# Patient Record
Sex: Female | Born: 1960 | Race: White | Hispanic: No | Marital: Married | Smoking: Former smoker
Health system: Southern US, Community
[De-identification: ages and names within clinical notes are randomized; demographics above are authoritative.]

## PROBLEM LIST (undated history)

## (undated) DIAGNOSIS — I2510 Atherosclerotic heart disease of native coronary artery without angina pectoris: Secondary | ICD-10-CM

## (undated) DIAGNOSIS — I219 Acute myocardial infarction, unspecified: Secondary | ICD-10-CM

## (undated) DIAGNOSIS — E669 Obesity, unspecified: Secondary | ICD-10-CM

## (undated) DIAGNOSIS — I499 Cardiac arrhythmia, unspecified: Secondary | ICD-10-CM

## (undated) DIAGNOSIS — K219 Gastro-esophageal reflux disease without esophagitis: Secondary | ICD-10-CM

## (undated) DIAGNOSIS — I10 Essential (primary) hypertension: Secondary | ICD-10-CM

## (undated) DIAGNOSIS — J449 Chronic obstructive pulmonary disease, unspecified: Secondary | ICD-10-CM

## (undated) DIAGNOSIS — E785 Hyperlipidemia, unspecified: Secondary | ICD-10-CM

## (undated) DIAGNOSIS — K859 Acute pancreatitis without necrosis or infection, unspecified: Secondary | ICD-10-CM

## (undated) DIAGNOSIS — M79602 Pain in left arm: Secondary | ICD-10-CM

## (undated) DIAGNOSIS — K760 Fatty (change of) liver, not elsewhere classified: Secondary | ICD-10-CM

## (undated) DIAGNOSIS — Z720 Tobacco use: Secondary | ICD-10-CM

## (undated) HISTORY — DX: Cardiac arrhythmia, unspecified: I49.9

## (undated) HISTORY — DX: Chronic obstructive pulmonary disease, unspecified: J44.9

## (undated) HISTORY — DX: Hyperlipidemia, unspecified: E78.5

## (undated) HISTORY — DX: Acute myocardial infarction, unspecified: I21.9

## (undated) HISTORY — DX: Obesity, unspecified: E66.9

## (undated) HISTORY — DX: Fatty (change of) liver, not elsewhere classified: K76.0

## (undated) HISTORY — DX: Pain in left arm: M79.602

## (undated) HISTORY — DX: Essential (primary) hypertension: I10

## (undated) HISTORY — DX: Gastro-esophageal reflux disease without esophagitis: K21.9

## (undated) HISTORY — DX: Atherosclerotic heart disease of native coronary artery without angina pectoris: I25.10

## (undated) HISTORY — DX: Tobacco use: Z72.0

---

## 2007-03-18 DIAGNOSIS — I219 Acute myocardial infarction, unspecified: Secondary | ICD-10-CM

## 2007-03-18 HISTORY — DX: Acute myocardial infarction, unspecified: I21.9

## 2007-05-02 ENCOUNTER — Emergency Department (HOSPITAL_COMMUNITY): Admission: EM | Admit: 2007-05-02 | Discharge: 2007-05-02 | Payer: Self-pay | Admitting: Emergency Medicine

## 2007-05-18 ENCOUNTER — Inpatient Hospital Stay (HOSPITAL_COMMUNITY): Admission: EM | Admit: 2007-05-18 | Discharge: 2007-05-26 | Payer: Self-pay | Admitting: Emergency Medicine

## 2007-05-18 ENCOUNTER — Ambulatory Visit: Payer: Self-pay | Admitting: Cardiology

## 2007-05-19 ENCOUNTER — Encounter (INDEPENDENT_AMBULATORY_CARE_PROVIDER_SITE_OTHER): Payer: Self-pay | Admitting: *Deleted

## 2007-05-20 ENCOUNTER — Ambulatory Visit: Payer: Self-pay | Admitting: Thoracic Surgery (Cardiothoracic Vascular Surgery)

## 2007-05-20 ENCOUNTER — Encounter (INDEPENDENT_AMBULATORY_CARE_PROVIDER_SITE_OTHER): Payer: Self-pay | Admitting: *Deleted

## 2007-05-21 ENCOUNTER — Encounter: Payer: Self-pay | Admitting: Thoracic Surgery (Cardiothoracic Vascular Surgery)

## 2007-05-21 HISTORY — PX: CORONARY ARTERY BYPASS GRAFT: SHX141

## 2007-05-28 ENCOUNTER — Emergency Department (HOSPITAL_COMMUNITY): Admission: EM | Admit: 2007-05-28 | Discharge: 2007-05-28 | Payer: Self-pay | Admitting: Emergency Medicine

## 2007-06-17 ENCOUNTER — Ambulatory Visit: Payer: Self-pay | Admitting: Cardiothoracic Surgery

## 2007-06-17 ENCOUNTER — Encounter: Admission: RE | Admit: 2007-06-17 | Discharge: 2007-06-17 | Payer: Self-pay | Admitting: Cardiothoracic Surgery

## 2007-07-01 ENCOUNTER — Encounter (HOSPITAL_COMMUNITY): Admission: RE | Admit: 2007-07-01 | Discharge: 2007-09-29 | Payer: Self-pay | Admitting: *Deleted

## 2007-08-04 ENCOUNTER — Ambulatory Visit: Payer: Self-pay | Admitting: Cardiology

## 2007-08-05 ENCOUNTER — Observation Stay (HOSPITAL_COMMUNITY): Admission: EM | Admit: 2007-08-05 | Discharge: 2007-08-05 | Payer: Self-pay | Admitting: Emergency Medicine

## 2008-06-15 ENCOUNTER — Emergency Department (HOSPITAL_COMMUNITY): Admission: EM | Admit: 2008-06-15 | Discharge: 2008-06-15 | Payer: Self-pay | Admitting: Emergency Medicine

## 2008-08-12 ENCOUNTER — Emergency Department (HOSPITAL_COMMUNITY): Admission: EM | Admit: 2008-08-12 | Discharge: 2008-08-12 | Payer: Self-pay | Admitting: Emergency Medicine

## 2008-10-17 ENCOUNTER — Inpatient Hospital Stay (HOSPITAL_COMMUNITY): Admission: EM | Admit: 2008-10-17 | Discharge: 2008-10-21 | Payer: Self-pay | Admitting: Emergency Medicine

## 2008-10-19 ENCOUNTER — Ambulatory Visit: Payer: Self-pay | Admitting: Internal Medicine

## 2008-10-20 ENCOUNTER — Encounter: Payer: Self-pay | Admitting: Internal Medicine

## 2008-10-23 ENCOUNTER — Encounter: Payer: Self-pay | Admitting: Internal Medicine

## 2010-02-19 ENCOUNTER — Emergency Department (HOSPITAL_COMMUNITY)
Admission: EM | Admit: 2010-02-19 | Discharge: 2010-02-19 | Payer: Self-pay | Source: Home / Self Care | Admitting: Emergency Medicine

## 2010-03-01 ENCOUNTER — Ambulatory Visit: Payer: Self-pay | Admitting: Cardiology

## 2010-03-07 ENCOUNTER — Telehealth (INDEPENDENT_AMBULATORY_CARE_PROVIDER_SITE_OTHER): Payer: Self-pay | Admitting: Radiology

## 2010-03-12 ENCOUNTER — Encounter: Payer: Self-pay | Admitting: Cardiology

## 2010-03-12 ENCOUNTER — Encounter (HOSPITAL_COMMUNITY)
Admission: RE | Admit: 2010-03-12 | Discharge: 2010-04-16 | Payer: Self-pay | Source: Home / Self Care | Attending: Cardiology | Admitting: Cardiology

## 2010-03-12 ENCOUNTER — Ambulatory Visit: Payer: Self-pay

## 2010-03-13 ENCOUNTER — Ambulatory Visit: Admission: RE | Admit: 2010-03-13 | Discharge: 2010-03-13 | Payer: Self-pay | Source: Home / Self Care

## 2010-04-18 NOTE — Progress Notes (Signed)
Summary: nuc pre-procedure  Phone Note Outgoing Call   Call placed by: Domenic Polite, CNMT,  March 07, 2010 11:23 AM Call placed to: Patient Reason for Call: Confirm/change Appt Summary of Call: Reviewed information on [employer] Information Sheet (see scanned document for further details).  Spoke with patient's husband.      Nuclear Med Background Indications for Stress Test: Evaluation for Ischemia, Post Hospital  Indications Comments: Post Hosp. 02/22/10  CP- Lt arm pain R/O MI  History: CABG, Echo, Heart Catheterization, Myocardial Infarction  History Comments: '09 MI-ant&lat stemi / cath / Cabg x 5 / Echo-nml EF= 55%  Symptoms: Chest Pain, SOB  Symptoms Comments: CP> Lt Arm   Nuclear Pre-Procedure Cardiac Risk Factors: Hypertension, Lipids, Smoker

## 2010-04-18 NOTE — Assessment & Plan Note (Signed)
Summary: Cardiology Nuclear Testing  Nuclear Med Background Indications for Stress Test: Evaluation for Ischemia, Post Hospital  Indications Comments: Post Hosp. 02/22/10  CP- Lt arm pain R/O MI  History: CABG, Echo, Heart Catheterization, Myocardial Infarction  History Comments: '09 MI-ant&lat stemi / cath / Cabg x 5 / Echo-nml EF= 55%  Symptoms: Chest Pain, DOE, Fatigue, Rapid HR, SOB  Symptoms Comments: CP> Lt Arm   Nuclear Pre-Procedure Cardiac Risk Factors: Family History - CAD, Hypertension, Lipids, Smoker Caffeine/Decaff Intake: none NPO After: 6:30 PM IV 0.9% NS with Angio Cath: 22g     IV Site: L Hand IV Started by: Cathlyn Parsons, RN Chest Size (in) 42     Cup Size D     Height (in): 59 Weight (lb): 208 BMI: 42.16 Tech Comments: Toprol held x 24hrs.  Nuclear Med Study 1 or 2 day study:  2 day     Stress Test Type:  Stress Reading MD:  Olga Millers, MD     Referring MD:  Dr. Peter Swaziland Resting Radionuclide:  Technetium 72m Tetrofosmin     Resting Radionuclide Dose:  33 mCi  Stress Radionuclide:  Technetium 42m Tetrofosmin     Stress Radionuclide Dose:  33 mCi   Stress Protocol Exercise Time (min):  6:00 min     Max HR:  150 bpm     Predicted Max HR:  169 bpm  Max Systolic BP: 183 mm Hg     Percent Max HR:  88.76 %     METS: 7.0 Rate Pressure Product:  62952    Stress Test Technologist:  Stanton Kidney, EMT-P     Nuclear Technologist:  Doyne Keel, CNMT  Rest Procedure  Myocardial perfusion imaging was performed at rest 45 minutes following the intravenous administration of Technetium 70m Tetrofosmin.  Stress Procedure  The patient exercised for 6 minutes.  The patient stopped due to fatigue and dyspnea, and Pt. denied any chest pain.  There were no significant ST-T wave changes.  Technetium 70m Tetrofosmin was injected at peak exercise and myocardial perfusion imaging was performed after a brief delay.  QPS Raw Data Images:  Acquisition technically good;  normal left ventricular size. Stress Images:  There is decreased uptake in the anterior wall and apex. Rest Images:  There is decreased uptake in the anterior wall and apex. Subtraction (SDS):  No evidence of ischemia. Transient Ischemic Dilatation:  .84  (Normal <1.22)  Lung/Heart Ratio:  .26  (Normal <0.45)  Quantitative Gated Spect Images QGS EDV:  79 ml QGS ESV:  33 ml QGS EF:  58 % QGS cine images:  Anterior and apical akinesis.   Overall Impression  Exercise Capacity: Fair exercise capacity. BP Response: Normal blood pressure response. Clinical Symptoms: No chest pain ECG Impression: No significant ST segment change suggestive of ischemia. Overall Impression: Abnormal stress nuclear study with prior anterior and apical infarct; no ischemia.  Appended Document: Cardiology Nuclear Testing COPY SENT TO DR. Sw[place]iland

## 2010-05-28 LAB — TROPONIN I: Troponin I: 0.01 ng/mL (ref 0.00–0.06)

## 2010-05-28 LAB — BASIC METABOLIC PANEL
CO2: 20 mEq/L (ref 19–32)
Calcium: 8.4 mg/dL (ref 8.4–10.5)
GFR calc Af Amer: >60 mL/min (ref >60)
Potassium: 3.1 mEq/L — ABNORMAL LOW (ref 3.5–5.1)
Sodium: 141 mEq/L (ref 135–145)

## 2010-05-28 LAB — DIFFERENTIAL
Basophils Absolute: 0 10*3/uL (ref 0.0–0.1)
Basophils Relative: 0 % (ref 0–1)
Eosinophils Relative: 1 % (ref 0–5)
Monocytes Absolute: 0.4 10*3/uL (ref 0.1–1.0)
Monocytes Relative: 6 % (ref 3–12)

## 2010-05-28 LAB — CK TOTAL AND CKMB (NOT AT ARMC)
Relative Index: INVALID (ref 0.0–2.5)
Total CK: 66 U/L (ref 7–177)

## 2010-05-28 LAB — POCT CARDIAC MARKERS
CKMB, poc: <1 ng/mL — ABNORMAL LOW (ref 1.0–8.0)
CKMB, poc: <1 ng/mL — ABNORMAL LOW (ref 1.0–8.0)
Myoglobin, poc: 33.2 ng/mL (ref 12–200)
Myoglobin, poc: 38 ng/mL (ref 12–200)
Troponin i, poc: <0.05 ng/mL (ref 0.00–0.09)

## 2010-05-28 LAB — CBC
HCT: 39.1 % (ref 36.0–46.0)
Hemoglobin: 12.6 g/dL (ref 12.0–15.0)
MCH: 28 pg (ref 26.0–34.0)
RBC: 4.5 MIL/uL (ref 3.87–5.11)

## 2010-06-22 LAB — COMPREHENSIVE METABOLIC PANEL
ALT: 9 U/L (ref 0–35)
ALT: 9 U/L (ref 0–35)
AST: 15 U/L (ref 0–37)
AST: 23 U/L (ref 0–37)
Albumin: 3.3 g/dL — ABNORMAL LOW (ref 3.5–5.2)
Alkaline Phosphatase: 106 U/L (ref 39–117)
BUN: 10 mg/dL (ref 6–23)
CO2: 26 mEq/L (ref 19–32)
Calcium: 8.1 mg/dL — ABNORMAL LOW (ref 8.4–10.5)
Chloride: 110 mEq/L (ref 96–112)
Creatinine, Ser: 0.58 mg/dL (ref 0.4–1.2)
GFR calc Af Amer: >60 mL/min (ref >60)
GFR calc Af Amer: >60 mL/min (ref >60)
GFR calc non Af Amer: >60 mL/min (ref >60)
Glucose, Bld: 91 mg/dL (ref 70–99)
Potassium: 3.6 mEq/L (ref 3.5–5.1)
Sodium: 139 mEq/L (ref 135–145)
Sodium: 139 mEq/L (ref 135–145)
Total Bilirubin: 0.8 mg/dL (ref 0.3–1.2)
Total Protein: 6.1 g/dL (ref 6.0–8.3)
Total Protein: 7 g/dL (ref 6.0–8.3)

## 2010-06-22 LAB — AMYLASE: Amylase: 55 U/L (ref 27–131)

## 2010-06-22 LAB — URINALYSIS, ROUTINE W REFLEX MICROSCOPIC
Glucose, UA: NEGATIVE mg/dL
Hgb urine dipstick: NEGATIVE
Specific Gravity, Urine: 1.038 — ABNORMAL HIGH (ref 1.005–1.030)
Urobilinogen, UA: 1 mg/dL (ref 0.0–1.0)

## 2010-06-22 LAB — CBC
HCT: 40.6 % (ref 36.0–46.0)
MCHC: 34.4 g/dL (ref 30.0–36.0)
MCV: 87.2 fL (ref 78.0–100.0)
Platelets: 231 10*3/uL (ref 150–400)
RBC: 4.24 MIL/uL (ref 3.87–5.11)
RDW: 14.8 % (ref 11.5–15.5)
RDW: 15.1 % (ref 11.5–15.5)
WBC: 9.8 10*3/uL (ref 4.0–10.5)

## 2010-06-22 LAB — LIPID PANEL
Total CHOL/HDL Ratio: 6.6 RATIO
VLDL: 25 mg/dL (ref 0–40)

## 2010-06-22 LAB — GLUCOSE, CAPILLARY
Glucose-Capillary: 71 mg/dL (ref 70–99)
Glucose-Capillary: 96 mg/dL (ref 70–99)

## 2010-06-22 LAB — DIFFERENTIAL
Basophils Absolute: 0.2 10*3/uL — ABNORMAL HIGH (ref 0.0–0.1)
Basophils Relative: 2 % — ABNORMAL HIGH (ref 0–1)
Eosinophils Relative: 2 % (ref 0–5)
Monocytes Absolute: 0.6 10*3/uL (ref 0.1–1.0)
Monocytes Relative: 6 % (ref 3–12)
Neutro Abs: 6.6 10*3/uL (ref 1.7–7.7)

## 2010-06-22 LAB — URINE MICROSCOPIC-ADD ON

## 2010-06-22 LAB — C-REACTIVE PROTEIN: CRP: 11.1 mg/dL — ABNORMAL HIGH (ref <0.6)

## 2010-06-22 LAB — HEMOGLOBIN A1C
Hgb A1c MFr Bld: 5.7 % (ref 4.6–6.1)
Mean Plasma Glucose: 117 mg/dL

## 2010-06-22 LAB — T4, FREE: Free T4: 0.71 ng/dL — ABNORMAL LOW (ref 0.80–1.80)

## 2010-06-25 LAB — CBC
HCT: 39.9 % (ref 36.0–46.0)
Hemoglobin: 13.8 g/dL (ref 12.0–15.0)
MCHC: 34.5 g/dL (ref 30.0–36.0)
MCV: 85.7 fL (ref 78.0–100.0)
Platelets: 232 10*3/uL (ref 150–400)
RBC: 4.66 MIL/uL (ref 3.87–5.11)
RDW: 15.4 % (ref 11.5–15.5)
WBC: 8.1 K/uL (ref 4.0–10.5)

## 2010-06-25 LAB — DIFFERENTIAL
Basophils Absolute: 0.1 10*3/uL (ref 0.0–0.1)
Basophils Relative: 1 % (ref 0–1)
Eosinophils Absolute: 0 K/uL (ref 0.0–0.7)
Eosinophils Relative: 0 % (ref 0–5)
Lymphocytes Relative: 32 % (ref 12–46)
Lymphs Abs: 2.6 10*3/uL (ref 0.7–4.0)
Monocytes Absolute: 0.5 10*3/uL (ref 0.1–1.0)
Monocytes Relative: 6 % (ref 3–12)
Neutro Abs: 4.9 10*3/uL (ref 1.7–7.7)
Neutrophils Relative %: 61 % (ref 43–77)

## 2010-06-25 LAB — POCT CARDIAC MARKERS
CKMB, poc: <1 ng/mL — ABNORMAL LOW (ref 1.0–8.0)
CKMB, poc: <1 ng/mL — ABNORMAL LOW (ref 1.0–8.0)
Troponin i, poc: <0.05 ng/mL (ref 0.00–0.09)

## 2010-06-25 LAB — COMPREHENSIVE METABOLIC PANEL WITH GFR
ALT: 9 U/L (ref 0–35)
AST: 17 U/L (ref 0–37)
Alkaline Phosphatase: 97 U/L (ref 39–117)
CO2: 21 meq/L (ref 19–32)
Chloride: 109 meq/L (ref 96–112)
GFR calc non Af Amer: >60 mL/min (ref >60)
Glucose, Bld: 109 mg/dL — ABNORMAL HIGH (ref 70–99)
Potassium: 3.4 meq/L — ABNORMAL LOW (ref 3.5–5.1)
Sodium: 137 meq/L (ref 135–145)

## 2010-06-25 LAB — COMPREHENSIVE METABOLIC PANEL
Albumin: 3.1 g/dL — ABNORMAL LOW (ref 3.5–5.2)
BUN: 11 mg/dL (ref 6–23)
Calcium: 8.4 mg/dL (ref 8.4–10.5)
Creatinine, Ser: 0.76 mg/dL (ref 0.4–1.2)
GFR calc Af Amer: >60 mL/min (ref >60)
Total Bilirubin: 0.5 mg/dL (ref 0.3–1.2)
Total Protein: 6.7 g/dL (ref 6.0–8.3)

## 2010-06-26 LAB — DIFFERENTIAL
Basophils Absolute: 0.1 10*3/uL (ref 0.0–0.1)
Basophils Relative: 1 % (ref 0–1)
Eosinophils Relative: 1 % (ref 0–5)
Monocytes Absolute: 0.4 10*3/uL (ref 0.1–1.0)
Neutro Abs: 4.8 10*3/uL (ref 1.7–7.7)

## 2010-06-26 LAB — POCT CARDIAC MARKERS: Troponin i, poc: <0.05 ng/mL (ref 0.00–0.09)

## 2010-06-26 LAB — CBC
Hemoglobin: 14 g/dL (ref 12.0–15.0)
MCHC: 34.1 g/dL (ref 30.0–36.0)
Platelets: 239 10*3/uL (ref 150–400)
RDW: 15.9 % — ABNORMAL HIGH (ref 11.5–15.5)

## 2010-06-26 LAB — POCT I-STAT, CHEM 8
Calcium, Ion: 1.09 mmol/L — ABNORMAL LOW (ref 1.12–1.32)
Chloride: 108 mEq/L (ref 96–112)
HCT: 41 % (ref 36.0–46.0)
TCO2: 22 mmol/L (ref 0–100)

## 2010-07-30 NOTE — Op Note (Signed)
NAMESKYANNE, WELLE NO.:  [id number]   MEDICAL RECORD NO.:  [id number]          PATIENT TYPE:  INP   LOCATION:  2031                         FACILITY:  [facility]   PHYSICIAN:  Salvatore Decent. Dorris Fetch, M.D.DATE OF BIRTH:  Nov 22, 1960   DATE OF PROCEDURE:  05/21/2007  DATE OF DISCHARGE:                               OPERATIVE REPORT   PREOPERATIVE DIAGNOSIS:  Severe three-vessel coronary disease, status  post myocardial infarction.   POSTOPERATIVE DIAGNOSIS:  Severe three-vessel coronary disease, status  post myocardial infarction.   PROCEDURE:  Median sternotomy, extracorporeal circulation, coronary  bypass grafting x5 (left internal mammary artery to LAD, sequential  saphenous vein graft to obtuse marginals 1 and 2, sequential saphenous  vein graft to posterior descending and posterolateral).   SURGEON:  Salvatore Decent. Dorris Fetch, M.D.   ASSISTANT:  Evelene Croon, M.D.   SECOND ASSISTANT:  Coral Ceo, P.A.   ANESTHESIA:  General.   FINDINGS:  Sternal osteoporosis.  Obesity.  Good-quality conduits.  Poor-  quality targets.   CLINICAL NOTE:  Ms. Fregeau is a 50 year old woman who presented with an  acute myocardial infarction.  She ruled in for an MI by enzymes.  This  was a non-Q-wave infarction.  At catheterization, she had total  occlusion of her LAD and severe three-vessel coronary disease.  The  patient initially was stable, but did have episodic chest pain  postcatheterization, and the following morning had two episodes of chest  pain.  Given the critical nature of her coronary disease, it was elected  to proceed with surgery, even though she was at increased risk for  bleeding complications due to her receiving Plavix and Integrilin.  The  patient and her family understood and accepted the risks and agreed to  proceed with surgery.   OPERATIVE NOTE:  Ms. Dondlinger was brought to the preoperative holding area  on May 21, 2007.  Then, the anesthesia service  under the direction of  Dr. Sheldon Silvan placed lines for monitoring arterial, central venous,  and pulmonary arterial pressure.  Intravenous antibiotics were  administered.  The patient was taken to the operating room,  anesthetized, and intubated.  Transesophageal echocardiography was  performed.  It revealed apical severe hypokinesis.  Otherwise, wall  motion was preserved.  There was no valvular pathology.  The chest,  abdomen, and legs were prepped and draped in the usual fashion.  A  median sternotomy was performed.  Simultaneously, an incision was made  in the medial aspect of the right leg, and the greater saphenous vein  was harvested endoscopically.  The saphenous vein was of good quality,  as was the mammary artery.  Five thousand units of heparin was  administered during the vessel harvest.  The remainder of the full  heparin dose was given prior to opening the pericardium.   The pericardium was opened.  The ascending aorta was inspected.  There  was no evidence of atherosclerotic disease.  The aorta was cannulated  via concentric 2-0 Ethibond pledgeted pursestring suture.  A dual-stage  venous cannula placed via the upper pursestring suture into the  right  atrial appendage.  Cardiopulmonary bypass was instituted, and the  patient was cooled to 32 degrees Celsius.  The coronary arteries were  inspected, and anastomotic sites were chosen.  The conduits were  inspected and cut to length.  A foam pad was placed in the pericardium  to protect the left phrenic nerve.  A temperature probe was placed in  myocardial septum.  A cardioplegia cannula was placed in the ascending  aorta.   The aorta was cross-clamped.  The left ventricle was emptied via the  aortic root vent.  Cardiac arrest then was achieved with a combination  of cold antegrade blood cardioplegia and topical iced saline.  There was  a rapid diastolic arrest and adequate myocardial septal cooling to 10  degrees Celsius.   The following distal anastomoses were performed.   First, a reverse saphenous vein graft was placed sequentially to the  posterior descending and posterolateral branches of the right coronary.  The posterior descending was a 1.5 mm vessel, but was diffusely  diseased.  A smaller 1 mm probe was passed distally.  The posterolateral  would not accept a 1.5 mm probe, but a 1 mm probe did pass.  A side-to-  side anastomosis was performed to the posterior descending and an end-to-  side to the posterolateral.  Both were done with running 7-0 Prolene  sutures.  All anastomoses were probed proximally and then distally at  their completion to ensure patency before tying the sutures.  At the  conclusion of each of the vein grafts, cardioplegia was administered to  assess flow and inspect for hemostasis.   Next, a reverse saphenous vein graft was placed sequentially to obtuse  marginals 1 and 2.  Obtuse marginal 1 was a 2 mm vessel but would only  accept a 1.5 mm probe due to diffuse disease.  Obtuse marginal 2 was  also a diffusely diseased vessel, 1.5 in diameter.  An end-to-side  anastomosis was performed to OM2, and a side-to-side to OM1.  There was  good flow through the graft and good hemostasis at the anastomoses.   Next, the left internal mammary artery was brought through a window in  the pericardium.  The distal end was beveled and was anastomosed end-to-  side to the distal LAD.  Of note, the diagonal branch of the LAD was a  large vessel but was very diffusely diseased and not graftable.  The LAD  itself was diffusely diseased.  A 1.5 mm probe would pass for short  distance from the site of the anastomosis.  A 1 mm probe did pass all  the way to the apex.  The mammary was a 2.5 mm good-quality vessel.  It  was anastomosed end-to-side with running 8-0 Prolene suture.  At  completion of the mammary-to-LAD anastomosis, the bulldog clamps were  briefly removed to inspect for hemostasis.   Immediate rapid septal  rewarming was noted.  There was good hemostasis.  The mammary pedicle  was tacked to the epicardial surface of the heart with 6-0 Prolene  sutures.   The rewarming was begun.  The proximal vein graft anastomoses then were  performed to 4.5 mm punch aortotomies with running 6-0 Prolene sutures.  At the completion of the final proximal anastomosis, the patient was  placed in Trendelenburg position.  Lidocaine was administered.  The  aortic root was de-aired.  The bulldog clamps were removed from the left  mammary artery, and the aortic crossclamp was removed.  Total  crossclamp  time was 76 minutes.  All proximal and distal anastomoses were inspected  for hemostasis.  Epicardial pacing wires were placed on the right  ventricle and right atrium.  When the patient was rewarmed to a core  temperature of 37 degrees Celsius, she was weaned from cardiopulmonary  bypass on the first attempt.  She was on 3 mcg/kg dopamine at the time  of separation from bypass.  Total bypass time was 111 minutes.  The  initial cardiac output was 5 liters per minute.  The patient remained  hemodynamically stable throughout post-bypass period.   A test dose of protamine was administered and was well tolerated.  The  atrial and aortic cannulae were removed.  The remainder of the protamine  was administered without incident.  The chest was irrigated with 1 liter  of warm normal saline containing 1 g of vancomycin.  Hemostasis was  achieved.  The pericardium could not be reapproximated.  A left pleural  and two mediastinal chest tubes were placed through separate subcostal  incisions. The sternum was closed with interrupted double heavy gauge  stainless steel wires.  The pectoralis fascia, subcutaneous tissue, and  skin were closed in standard fashion.  There was a missing needle from a  6-0 Prolene  suture on the final count.  A chest x-ray was performed in the operating  room.  The needle was  not present within the patient's chest.  The  patient then was transported from the operating room to the surgical  intensive care unit in fair condition.      Salvatore Decent Dorris Fetch, M.D.  Electronically Signed     SCH/MEDQ  D:  05/21/2007  T:  05/24/2007  Job:  [id number]   cc:   Elmore Guise., M.D.  Jeffrey A. Tawanna Cooler, MD

## 2010-07-30 NOTE — Cardiovascular Report (Signed)
April [name], April [name] NO.:  [id number]   MEDICAL RECORD NO.:  [id number]          PATIENT TYPE:  INP   LOCATION:  2908                         FACILITY:  [facility]   PHYSICIAN:  Elmore Guise., M.D.DATE OF BIRTH:  09-30-1960   DATE OF PROCEDURE:  05/19/2007  DATE OF DISCHARGE:                            CARDIAC CATHETERIZATION   INDICATIONS FOR PROCEDURE:  Late presentation of anterolateral ST  elevation myocardial infarction.   DESCRIPTION OF PROCEDURE:  The patient was brought to the cardiac  catheterization lab.  After appropriate informed consent, she is prepped  and draped in usual sterile fashion.  Approximately 10 mL of 1%  lidocaine was used for local anesthesia.  A Smart needle was used for  access.  A 5 French sheath was placed in the right femoral artery.  Coronary angiography, and LV angiography were then performed.  Limited  right femoral angiography was also performed showing no significant  femoral or iliac arterial disease.  Following the procedure, the patient  was transferred from the cardiac catheterization lab in stable  condition.   FINDINGS:  1. Left main.  Large vessel with mild luminal irregularities.  2. LAD.  100% proximal to mid occlusion, distal vessel fills faintly      via left to left and right to left collaterals.  3. D1.  Small to moderate size vessel with mild luminal irregularities      in the mid and distal branch with 100% occlusion.  4. Left circumflex.  Nondominant, large vessel which tapers to short      vessel in the AV groove.  The circumflex in the AV groove is small      with mild to moderate luminal irregularities.  The proximal      circumflex gives two large OM vessels.  OM1 is a large vessel with      long proximal 50-60% stenosis, mid and distal vessel has moderate      luminal irregularities.  This vessel comes from the proximal      portion of the circumflex and extends across the lateral wall to      the  lateral apical area.  OM2 is a small to moderate size vessel      with diffuse moderate disease noted.  5. RCA.  Dominant with diffuse moderate luminal irregularities.  She      has a mid 40% stenosis with PDA showing moderate size with proximal      70% stenosis.  PLV has mild luminal irregularities.  6. LV done in the RAO and LAO projections.  In the RAO projections EF      was estimated at 50% with severe mid and distal and periapical      hypokinesis.  In the LAO projection EF was approximately 50% with      good movement of the septal wall.  7. Limited right femoral angiogram:  common femoral artery appears      normal.  No dissection or extravasation of contrast.   IMPRESSION:  1. Obstructive multivessel disease.  2. Low normal left ventricular systolic function,  ejection fraction of      50% with mid distal and periapical hypokinesis.   PLAN:  At this time I would recommend continued aggressive medical  treatment.  We will stop her Plavix, start Coreg 3.125 mg twice daily,  and discontinue her metoprolol at this time.  She is in Lipitor 40 mg  daily.  We will add  an ACE inhibitor, lisinopril 2.5 mg as her blood pressure tolerates.  We  will try to wean her from her nitroglycerin drip as well as stop her  heparin and Integrilin.  She will need a surgical consult for surgical  revascularization plus or minus viability study.  Her anterior wall is  severely hypokinetic, but does appear to move.      Elmore Guise., M.D.  Electronically Signed     TWK/MEDQ  D:  05/19/2007  T:  05/19/2007  Job:  [id number]   cc:   Windle Guard, M.D.

## 2010-07-30 NOTE — Consult Note (Signed)
NAMEJONA, April [name] NO.:  [id number]   MEDICAL RECORD NO.:  [id number]          PATIENT TYPE:  INP   LOCATION:  2908                         FACILITY:  [facility]   PHYSICIAN:  Salvatore Decent. Dorris Fetch, M.D.DATE OF BIRTH:  12/07/1960   DATE OF CONSULTATION:  05/20/2007  DATE OF DISCHARGE:                                 CONSULTATION   REASON FOR CONSULTATION:  Severe three-vessel coronary disease.   HISTORY OF PRESENT ILLNESS:  Ms. Kegg is a 50 year old white female  with a history of tobacco abuse and gastroesophageal reflux.  She  presented on May 18, 2007 with complaints of chest pain that had  started at 6:00 p.m. on May 17, 2007.  She described this as a severe  substernal chest pressure that then moved to her shoulders and then to  her neck and jaw bilaterally.  This persisted throughout the night.  She  initially thought maybe she had pulled a muscle or it might be related  to her reflux which has been worsening recently.  She finally presented  to the emergency room, although pain had improved, it had not resolved.  She was placed on nitroglycerin drip and her chest pain completely  resolved.  She ruled in for myocardial infarction with initial troponin  of 26.5 and an MB of greater than 80.  She was started on heparin,  Integrilin, Plavix and aspirin.  Yesterday she underwent cardiac  catheterization which revealed total occlusion of her proximal LAD and  severe three-vessel coronary disease.  Ejection fraction was  approximately 50%.  She did have distal apical severe hypokinesis verses  akinesis.  Her Plavix was discontinued.  She was continued with  aggressive medical therapy, however, this morning she had two episodes  of chest pain which were relieved with nitroglycerin.  She currently is  pain-free.   PAST MEDICAL HISTORY:  Significant for  1. Gastroesophageal reflux.  2. Hypertension during pregnancy which did not persist.  3. Tobacco abuse.   MEDICATIONS PRIOR TO ADMISSION:  None.   CURRENT MEDICATIONS:  1. Imdur 60 mg daily.  2. Prinivil 2.5 mg daily.  3. Lipitor 80 mg daily.  4. Coreg 3.125 mg b.i.d.  5. Ecotrin 325 mg daily.  6. Protonix 40 mg daily.  7. Lovenox 40 mg daily subcutaneously.   ALLERGIES:  She has no known drug allergies.   FAMILY HISTORY:  No early cardiovascular disease.   SOCIAL HISTORY:  A 30-pack-year history of smoking one pack a day for 30  years.  Does not use alcohol.  She is married.  She lives with her  husband.  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   REVIEW OF SYSTEMS:  She does state that she has been having increasing  problems with reflux with frequent belching and significant heartburn  recently.   All other systems are negative.   PHYSICAL EXAMINATION:  Ms. Mochizuki is an obese white female in no acute  distress.  VITAL SIGNS:  Blood pressure is 99/62, pulse 83, respirations are 19.  She is on room air with oxygen saturations 92%.  GENERAL:  She is overweight but otherwise well-developed.  NEUROLOGIC:  She is alert and oriented x3, appropriate and grossly  intact.  No focal deficits.  HEENT:  Exam is unremarkable.  NECK:  Supple without thyromegaly, adenopathy or bruits.  CARDIAC:  Regular rate and rhythm.  Normal S1 and S2.  No murmurs, rubs,  or gallops.  LUNGS:  Clear to auscultation and percussion.  ABDOMEN:  Soft and nontender.  EXTREMITIES:  Without clubbing, cyanosis or edema.  She has 2+ pulses  throughout.   LABORATORY DATA:  Cardiac enzymes previously mentioned.  Her sodium 136,  potassium 3.6, BUN and creatinine are 9 and 0.64, glucose 96.  Hematocrit 37, white count 9.4, platelets 246.   EKG shows T-wave abnormality laterally and a anterior infarct age  undetermined.   IMPRESSION:  Ms. Arave is a 50 year old woman who presents with an acute  myocardial infarction.  She has had unstable postinfarct angina.  She  had a large infarct involving her anterior wall and  apex due to total  occlusion of her left anterior descending.  She does have some faint  collateralization but the vessel remains compromised.  Her ejection  fraction is 50% with severe apical hypokinesis in the setting of three-  vessel disease with acute infarct with postinfarct angina and left  ventricular dysfunction.  Coronary artery bypass grafting is indicated  for both survival benefit as well as relief of symptoms.  I discussed in  detail with the patient and her husband the nature and extent of the  operation including the incisions to be used.  We did discuss the risks  and benefits.  She understands the risks include, but are not limited  to, death, stroke, myocardial infarction, deep venous thrombosis,  pulmonary embolism, bleeding, possible need for transfusions, infections  as well as other organ system dysfunction including respiratory, renal  or gastrointestinal complications.   We discussed the timing of surgery.  She did receive Plavix.  Ideally we  would like to wait 5-7 days for the Plavix to wear off, decrease her  bleeding risk, however, she would be at risk for further myocardial  damage in the interim.  I discussed with her and her husband the option  of proceeding with surgery tomorrow, albeit with an increased bleeding  risk verses delaying surgery until she has been off Plavix for five  days, which would be next Tuesday, with the interim risk of additional  myocardial damage.  They wish to proceed with surgery tomorrow.  She  understands and accepts the risks of surgery and agrees to proceed.      Salvatore Decent Dorris Fetch, M.D.  Electronically Signed     SCH/MEDQ  D:  05/20/2007  T:  05/20/2007  Job:  [id number]   cc:   Vernice Jefferson, MD  Windle Guard, M.D.

## 2010-07-30 NOTE — H&P (Signed)
April [name], April [name] NO.:  [id number]   MEDICAL RECORD NO.:  [id number]          PATIENT TYPE:  INP   LOCATION:  6524                         FACILITY:  [facility]   PHYSICIAN:  Lowella Bandy, MD      DATE OF BIRTH:  1960/05/10   DATE OF ADMISSION:  08/04/2007  DATE OF DISCHARGE:                              HISTORY & PHYSICAL   PRIMARY CARDIOLOGIST:  Rosine Abe, M.D.   CHIEF COMPLAINT:  Palpitations.   HISTORY OF PRESENT ILLNESS:  Ms. April [name] is a very pleasant 50 year old  white female with coronary artery disease; with a history of recent  myocardial infarction in March 2009 and subsequent urgent coronary  artery bypass graft surgery who, during an argument with her  stepdaughter earlier this evening, felt her heart takeoff racing.  After  her heart racing persisted for some time, she became very concerned and  called EMS.  She was found to be in atrial fibrillation with a rapid  ventricular response; with pulse greater than 130.  She has no known  previous history of atrial fibrillation.  She denies any associated  chest pain, but does report some mild shortness of breath.  She denies  any lower extremity edema, orthopnea, syncope or near-syncope.   PAST MEDICAL HISTORY:  1. Coronary artery disease:  Late presenting anterolateral ST      elevation MI in March 2009; with subsequent CABG with LIMA to the      LAD, saphenous vein graft sequentially to PDA to posterolateral,      saphenous vein graft sequential from first obtuse marginal to      second obtuse marginal.  Ejection fraction 50%.  2. Questionable history of hypertension.   MEDICATIONS:  1. Aspirin 325 mg p.o. daily.  2. Plavix 75 mg p.o. daily.  3. Lasix 40 mg p.o. every other day.  4. Metoprolol 25 mg p.o. daily (questionable sustained release).  5. Lipitor 80 mg p.o. daily.   ALLERGIES:  NO KNOWN DRUG ALLERGIES.   SOCIAL HISTORY:  The patient quit smoking at the time of her MI in March  2009.  She denies alcohol use.  She lives with her husband and  stepdaughter.  She formerly worked at [employer] prior to her MI.   FAMILY HISTORY:  Her mother had bypass surgery and a heart attack in her  50s.  There is no known coronary disease in any of her siblings.   REVIEW OF SYSTEMS:  Of note, the patient is undergoing menopausal  symptoms, and reports her last menstrual period was about 3 years ago.  The remainder of the 10-systems reviewed were negative, other than as  noted above in the HPI.   PHYSICAL EXAMINATION:  Temperature afebrile, blood pressure 125/78,  pulse 96, oxygen saturation 96% on 2 liters nasal cannula.  GENERAL:  The patient breathing comfortably; in no apparent distress.  HEENT:  Normocephalic, atraumatic.  Extraocular movements intact.  Sclerae anicteric.  NECK:  Supple, no masses appreciated.  No carotid bruits or JVD.  CARDIOVASCULAR:  Regular rate and rhythm, normal S1 and S2.  No murmurs,  rubs or gallops.  CHEST:  Clear to auscultation bilaterally.  ABDOMEN:  Soft, obese, nontender and nondistended.  EXTREMITIES:  Warm, no edema.  SKIN:  No rashes.  MUSCULOSKELETAL:  No joint effusions or erythema.  NEUROLOGIC:  Alert and oriented x3.  Cranial nerves grossly intact;  moving all extremities well.  PSYCHIATRIC:  Alert and oriented x3.  Affect pleasant and appropriate.   EKG:  Her initial EKG reviewed showed atrial fibrillation with rapid  ventricular response of 138 beats per minute; with diffuse nonspecific T-  wave abnormality.  Subsequent repeat EKG shows normal sinus rhythm at 98  beats per minute, poor R-wave progression, no ST or T-wave abnormality.   ASSESSMENT:  A 50 year old female with coronary artery disease status  post recent myocardial infarction and coronary artery bypass grafting in  March 2009 who presents with symptomatic new onset paroxysmal atrial  fibrillation.  She spontaneously converted to sinus rhythm in the  emergency  department.   PLAN:  1. We will stop her Diltiazem drip that was started in the emergency      department, and transition her to oral Diltiazem; in addition to      the remainder of her baseline regimen.  2. We will continue her home medications of aspirin, Plavix,      metoprolol and Lipitor.  3. We will check thyroid function test, although hyperthyroidism seems      very unlikely.  4. If there are no further recurrences of her atrial fibrillation, she      may not need long-term Coumadin therapy at this point.  Of note,      she was given one dose of therapeutic Lovenox in the emergency      department prior to her conversion to sinus rhythm.  5. If no further recurrences of atrial fibrillation, she will likely      be able to go home in the morning.      Lowella Bandy, MD  Electronically Signed     JJC/MEDQ  D:  08/05/2007  T:  08/05/2007  Job:  [id number]

## 2010-07-30 NOTE — Discharge Summary (Signed)
NAMELAURREN, April [name] NO.:  [id number]   MEDICAL RECORD NO.:  [id number]          PATIENT TYPE:  INP   LOCATION:  6524                         FACILITY:  [facility]   PHYSICIAN:  Elmore Guise., M.D.DATE OF BIRTH:  Oct 25, 1960   DATE OF ADMISSION:  08/04/2007  DATE OF DISCHARGE:  08/05/2007                               DISCHARGE SUMMARY   DISCHARGE DIAGNOSIS:  1. Brief episode of atrial fibrillation.  2. History of coronary artery disease status post coronary artery      bypass grafting.  3. Dyslipidemia.  4. Diet-controlled diabetes mellitus.   HISTORY OF PRESENT ILLNESS:  April April [name] is a very pleasant 50 year old  white female who presented with palpitations and tachycardia.  She was  found to be in rapid atrial fibrillation.  She was given Cardizem in the  emergency room with rate controlled.  She converted to normal sinus  rhythm after rate control was obtained.  She was admitted overnight to  continue evaluation to see if any further the AFib would occur.   HOSPITAL COURSE:  The patient's hospital course was uncomplicated.  She  remained in normal sinus rhythm after admission.  She is now been up and  ambulatory without any significant problems.  Her heart rate has been in  the 90-95 range.  She is in normal sinus rhythm.  Her blood pressure is  stable at 130/60.  Her blood work was normal.  She had a BUN and  creatinine of 12 and 0.5, potassium level 3.5.  Cardiac markers were  negative.  Her BNP level was 180.  Her TSH is still pending at time of  dictation.  She will be discharged home to continue the following  medications.  1. Lipitor 40 mg daily.  2. Toprol XL 50 mg daily (up from 25).  3. Plavix 75 mg daily.  4. Aspirin 325 mg daily.  5. Lasix 40 mg p.r.n.  6. She also uses Tylenol PM as needed for sleep disturbance.   Unless she has any further problems, I plan to see her back at a regular  scheduled appointment.  I did discuss should she have  any problems with  rapid rates, she is first to take an extra dose of Toprol.  If this does  not slow her down, then call the office or EMS if she becomes  symptomatic.  All her questions were answered prior to discharge.      Elmore Guise., M.D.  Electronically Signed     TWK/MEDQ  D:  08/05/2007  T:  08/06/2007  Job:  [id number]

## 2010-07-30 NOTE — H&P (Signed)
NAMESUMNER, BOESCH NO.:  [id number]   MEDICAL RECORD NO.:  [id number]          PATIENT TYPE:  INP   LOCATION:  5127                         FACILITY:  [facility]   PHYSICIAN:  Renee Ramus, MD       DATE OF BIRTH:  Feb 23, 1961   DATE OF ADMISSION:  10/17/2008  DATE OF DISCHARGE:                              HISTORY & PHYSICAL   HISTORY OF PRESENT ILLNESS:  The patient is a 50 year old female who has  abdominal pain x3 days prior to admission.  The patient denies chest  pain, shortness breath, PND, or orthopnea.  The patient denies diarrhea,  constipation, fevers, chills, or night sweats.  The patient says the  pain is around the middle of her abdomen extending to her back.  The  patient has no previous history of pancreatitis.   PAST MEDICAL HISTORY:  1. Coronary artery disease, status post coronary artery bypass graft.  2. Hyperlipidemia.  3. Diabetes mellitus, type 2, uncontrolled.  4. Paroxysmal atrial fibrillation.  5. Hypertension.  6. Gastroesophageal reflux disease.  7. Morbid obesity.   SOCIAL HISTORY:  The patient denies alcohol use and does smoke one-half  pack per day.   FAMILY HISTORY:  Not available.   REVIEW OF SYSTEMS:  All other comprehensive review of systems is  negative.   ALLERGIES:  The patient has no known drug allergies.   CURRENT MEDICATIONS:  1. Vitamin D 1000 units p.o. daily.  2. Multivitamin p.o. daily.  3. Metoprolol 50 mg p.o. b.i.d.  4. Aspirin 325 mg p.o. daily.  5. Pravastatin 40 mg p.o. daily.   PHYSICAL EXAMINATION:  GENERAL:  This is a morbidly obese female  currently in no apparent disease.  VITAL SIGNS:  Blood pressure 126/75, heart rate 68, respiratory rate 18,  temperature 97.9.  HEENT:  No jugular venous distention or lymphadenopathy.  Oropharynx is  clear.  Mucous membranes pink and moist.  TMs clear bilaterally.  Pupils  equal and reactive to light and accommodation.  Extraocular muscles are  intact.  CARDIOVASCULAR:  Regular rate and rhythm without murmurs, rubs, or  gallops.  PULMONARY:  Lungs are clear to auscultation bilaterally.  ABDOMEN:  Soft, obese, nontender, nondistended without  hepatosplenomegaly.  Bowel sounds are present.  She has no rebound or  guarding.  EXTREMITIES:  She has no clubbing, cyanosis, or edema.  She has good  peripheral pulses in dorsalis pedis and radial arteries.  She is able to  move all extremities.  NEUROLOGIC:  Cranial nerves II-XII are grossly intact.  She has no focal  neurological deficits.   STUDIES:  Abdominal CT scan shows inflammation in the head of the  pancreas consistent with pancreatitis.   LABORATORY DATA:  White count 9.8, H and H 14/41, MCV 87, platelets 231.  Sodium 139, potassium 3.8, chloride 110, bicarb 22, BUN 10, creatinine  0.54, glucose 110.  UA shows specific gravity of 1.038 with 15 ketones.   ASSESSMENT AND PLAN:  1. Pancreatitis.  The patient does not have an abnormal amylase or      lipase; however, her  symptomatology and CT scan results certainly      are consistent with pancreatitis.  We will check full lipid panel      as well as inflammatory markers to further explore potential      etiologies for pancreatitis or alternative diagnoses.  We will make      the patient n.p.o. and treat with pain medications.  2. Coronary artery disease, currently stable.  The patient will only      require 81 mg p.o. daily of aspirin and we will advise at discharge      to decrease her dose to that amount.  3. Dehydration.  We will give IV fluids.  4. Diabetes mellitus, type 2.  We will check hemoglobin A1c.  5. Morbid obesity.  We will check TSH and free T4.  6. Paroxysmal atrial fibrillation.  We will continue metoprolol.  7. Tobacco.  The patient declines patch.  We counseled the patient in      respect to tobacco cessation.   DISPOSITION:  The patient is full code.  H and P was constructed by  reviewing past medical history,  conferring with emergency medical room  physician, reviewing the emergency medical record.   TIME SPENT:  1 hour.      Renee Ramus, MD  Electronically Signed     JF/MEDQ  D:  10/17/2008  T:  10/18/2008  Job:  [id number]

## 2010-07-30 NOTE — H&P (Signed)
April [name], KANER NO.:  [id number]   MEDICAL RECORD NO.:  [id number]          PATIENT TYPE:  INP   LOCATION:  2908                         FACILITY:  [facility]   PHYSICIAN:  Vernice Jefferson, MD          DATE OF BIRTH:  June 26, 1960   DATE OF ADMISSION:  05/18/2007  DATE OF DISCHARGE:                              HISTORY & PHYSICAL   CHIEF COMPLAINT:  Chest pain x24 hours.   HISTORY OF PRESENT ILLNESS:  The patient is a 50 year old white female  with only cardiovascular risk factors of tobacco abuse who comes in with  complaints of substernal chest pressure radiating to bilateral neck and  arms that began last night approximately 6 P.M.  The patient reports  that this chest pressure persisted throughout the night and day and she  presented here approximately 3 to 4 o'clock today with a milder version  of what occurred to her overnight.  The patient reports after being here  and being put on nitro drip her chest pain has completely resolved and  now has no complaints of this chest pressure.  She reports that the neck  pain is what persisted and the intense chest pressure did appear to  subside mildly overnight.  She has never had any prior cardiac history.  Prior to yesterday, she had been in generally good health.   PAST MEDICAL HISTORY:  1. Gastroesophageal reflux disease.  2. Hypertension during her last pregnancy.   SOCIAL HISTORY:  Probably 30-pack-year history of smoking.  No alcohol,  no drug use.   FAMILY HISTORY:  Reviewed and noncontributory to the patient's present  medical condition.  There is no early cardiovascular disease.  There is  no one with cardiomyopathy in her family.   ALLERGIES:  No known drug allergies.   MEDICATIONS:  None.  She does take a p.r.n. antacid.   REVIEW OF SYSTEMS:  Negative 11 point review of systems except for  otherwise dictated in the above history of present illness.   PHYSICAL EXAMINATION:  VITAL SIGNS:  Her blood  pressure was 158/99.  Heart rate 111.  Temperature afebrile.  GENERAL:  Well-developed, well-nourished white female in no acute  distress.  HEENT:  Moist mucous membranes.  no conjuntival pallor  NECK:  Supple.  Full range of motion.  No jugular venous distention.  CARDIOVASCULAR:  Regular rate and rhythm with no rubs, murmurs or  gallops.  CHEST:  Clear to auscultation bilaterally.  No rales, rhonchi or  wheezing.  ABDOMEN:  Soft, nontender, nondistended. Normal active bowel sounds.  EXTREMITIES:  No peripheral edema.  Pulses 2+ bilaterally.  NEUROLOGICAL:  Alert and oriented x3.  Cranial nerves II-XII are grossly  intact.  Muscle strength is 5/5 bilaterally.   RADIOLOGICAL STUDIES:  CXR Demonstrates no acute infiltrative process.  EKG demonstrates a normal sinus rhythm with an evolving lateral ST  segment elevation myocardial infarction with Q's in I and aVL.   LABORATORY DATA:  Significant for an MB of greater than 80.  Troponin of  26.5.  Hemoglobin of 14.9.  BUN and creatinine of 7 and 0.56.   IMPRESSION:  1. Acute coronary syndrome, evolving ST segment elevation myocardial      infarction, greater than 24 hours old by symptoms.  2. Gastroesophageal reflux disease.   PLAN:  Patient has already demonstrated infarct zone given her Q waves  in I and aVL and given lack of symptoms for at least right now, we will  not urgently rush her to the catheterization laboratory.  However, we  will start aggressive antiplatelet and antithrombotic medications  including aspirin, Plavix, heparin and a 2B3A antiplatelet as well as  nitroglycerin drip overnight.  We will also initiate beta blockade and  titrate to heart rate in the 60's as long as her blood pressure  tolerates this.  Put her on the board for a heart catheterization in the  morning.  Will also get an A.M. EKG and 2-D echocardiogram in the  morning.  Additionally, if the patient has any recurrence of chest pain,  will have low  threshold to calling catheterization lab.      Vernice Jefferson, MD  Electronically Signed     JT/MEDQ  D:  05/18/2007  T:  05/19/2007  Job:  [id number]

## 2010-07-30 NOTE — Discharge Summary (Signed)
April [name], April NO.:  [name]   MEDICAL RECORD NO.:  [name]          PATIENT TYPE:  INP   LOCATION:  5127                         FACILITY:  [facility]   PHYSICIAN:  Marcellus Scott, MD     DATE OF BIRTH:  1960/12/04   DATE OF ADMISSION:  10/17/2008  DATE OF DISCHARGE:  10/21/2008                               DISCHARGE SUMMARY   PRIMARY MEDICAL DOCTOR:  Dr. Michela Pitcher in [place], [place].   DISCHARGE DIAGNOSES:  1. Abdominal pain.  2. Focal acute pancreatitis, unclear etiology.  3. Fatty liver/hepatomegaly.  4. Hypertension.  5. Obesity.  6. Mild gastritis on EGD.  7. Diet controlled type 2 diabetes mellitus.  8. Coronary artery disease status post bypass graft.  9. Hyperlipidemia.  10.Paroxysmal atrial fibrillation.  11.Gastroesophageal reflux disease.   DISCHARGE MEDICATIONS:  1. Vitamin D 1000 international units p.o. daily.  2. Multivitamins one p.o. daily.  3. Metoprolol 50 mg p.o. b.i.d.  4. Aspirin 325 mg p.o. daily.  5. Pravastatin 40 mg p.o. daily.  6. Prilosec 20 mg over-the-counter p.o. daily.  7. Tylenol 325 mg tablet, 2 tablets p.o. q.4 hourly p.r.n. for pain.   PROCEDURES:  1. EGD by Lina Sar, MD on October 20, 2008.  Impression - mild      gastritis at the pylorus, otherwise normal exam and nothing to      account for abdominal pain.  2. CT of the abdomen without contrast October 20, 2008.  Impression -      slight increase in mild uncomplicated focal pancreatitis involving      the uncinate process. This is superimposed upon underlying moderate      fatty replacement.  Development of trace right greater than left      pleural effusion.  Tiny left lower lobe lung nodule.  Hepatomegaly      and fatty infiltration of the liver.  3. Ultrasound abdomen August 5.  Impression - fatty infiltration of      the liver and hepatomegaly.  Negative for gallbladder disease.      Pancreas not visualized due to overlying bowel gas.  4. CT of  the abdomen with contrast October 17, 2008.  Impression -      retroperitoneal inflammatory / edematous changes inferior to the      pancreatic head and uncinate process.  5. CT of the pelvis with contrast October 17, 2008.  Impression - normal      appendix, arterial calcifications.   PERTINENT LABS:  Lipase 21.  Cardiac panel cycled and negative.  Serum  amylase 55, ESR 36, CRP 11.1, hemoglobin A1c 5.7, free T4 0.71, TSH  1.832.  Lipid panel with HDL 23, LDL 103.  Urinalysis was not suggestive  of urinary tract infection.   CONSULTATIONS:  Gastroenterology, Hedwig Morton. Juanda Chance, MD.   HOSPITAL COURSE AND PATIENT DISPOSITION:  Ms. Beaufort is a pleasant 50-  year-old Caucasian female patient with extensive past medical history  including coronary artery disease status post CABG, hyperlipidemia, type  2 diabetes mellitus, paroxysmal atrial fibrillation, hypertension who  works at [employer]  and came in with a 3 day history of abdominal pain and  was admitted for further evaluation, investigation and management.  1. Abdominal pain.  The patient indicated that this was in the middle      of the abdomen extending to the back.  Evaluation in the emergency      room revealed CT suggestive of focal pancreatitis in the uncinate      process of the pancreas.  However, her LFTs and lipase were normal.      She was placed on clear liquids, IV fluids, pain medications.      However, the pain persisted.  Repeat amylase was also negative and      her diagnosis was not clear.  The other differentials were      obviously gastritis, peptic ulcer disease, flare up of her reflux      disease or musculoskeletal pain.  GI was consulted.  They performed      EGD with findings as above.  They have seen her today and indicate      that she is doing better and abdomen is benign and they have      advanced her diet which she has tolerated and cleared her for      discharge home.  She will follow up with Dr. Juanda Chance in 2-3  weeks      after discharge.  She has been advised a low-fat diet.  The patient      requested to fill the FMLA form for her work which has been done.  2. Focal acute pancreatitis.  Etiology unclear.  The patient is      asymptomatic of abdominal pain for the last 12-18 hours.  3. Fatty liver and hepatomegaly.  Outpatient followup with GI.  4. Hypertension.  Might need tighter control.  Continue home      medications.  5. Mild gastritis on EGD.  Protonix was recommended but the patient      indicates she cannot afford it but prefers to buy over-the-counter      Prilosec.   The patient at this time is stable for discharge home.  She is to follow  up with Dr. Lina Sar in 2-3 weeks after discharge and to call her  primary MD's office for followup appointment.   Time taken in coordinating this discharge is 35 minutes.      Marcellus Scott, MD  Electronically Signed     AH/MEDQ  D:  10/21/2008  T:  10/21/2008  Job:  [id number]   cc:   Hedwig Morton. Juanda Chance, MD  Dr Michela Pitcher

## 2010-07-30 NOTE — Discharge Summary (Signed)
NAMEYISSEL, HABERMEHL NO.:  [id number]   MEDICAL RECORD NO.:  [id number]          PATIENT TYPE:  INP   LOCATION:  2031                         FACILITY:  [facility]   PHYSICIAN:  Salvatore Decent. Dorris Fetch, M.D.DATE OF BIRTH:  07-Oct-1960   DATE OF ADMISSION:  05/18/2007  DATE OF DISCHARGE:                               DISCHARGE SUMMARY   FINAL DIAGNOSIS:  Severe three-vessel coronary artery disease, status  post myocardial infarction.   IN-HOSPITAL DIAGNOSES:  1. Volume overload postoperatively.  2. Acute blood loss anemia postoperatively.   SECONDARY DIAGNOSES:  1. Gastroesophageal reflux disease.  2. Hypertension during pregnancy, which did not persist.  3. Tobacco abuse.   IN-HOSPITAL OPERATIONS AND PROCEDURES:  1. Cardiac catheterization.  2. Coronary artery bypass grafting x5 using a left internal mammary      artery to left anterior descending, sequential saphenous vein graft      to obtuse marginals #1 and 2, sequential saphenous vein graft to      posterior descending and posterolateral artery.   HISTORY AND PHYSICAL AND HOSPITAL COURSE:  April [name] is a 50 year old  female who presented with an acute myocardial infarction.  She ruled in  for a myocardial infarction by enzymes.  This was a non-Q-wave  infarction.  At catheterization she had total occlusion of her LAD and  severe three-vessel coronary artery disease.  The patient initially was  stable but did have episodic chest pain post catheterization and the  following morning had two episodes of chest pain.  Given the critical  nature of her coronary disease, it was elected to proceed with surgery.  Dr. Dorris Fetch discussed with the patient undergoing coronary artery  bypass grafting.  He discussed risks and benefits with the patient.  The  patient acknowledged her understanding and agreed to proceed.  Surgery  was scheduled for May 21, 2007.  For details of the patient's past  medical history and  physical exam, please see dictated H&P.  The patient  did have preoperative bilateral carotid duplex ultrasound done showing  no significant ICA stenosis.   The patient was taken to the operating room May 21, 2007, where she  underwent coronary bypass grafting x5 using a left internal mammary  artery to left anterior descending, sequential saphenous vein graft to  obtuse marginals #1 and 2, sequential saphenous vein graft to posterior  descending and posterolateral.  The patient tolerated this procedure  well and was transferred to the intensive care unit in stable condition.  Postoperatively the patient was noted to be hemodynamically stable.  She  was extubated the evening of surgery.  Post extubation the patient was  noted to be alert and oriented x4, neuro intact.  She was placed on  nasal cannula post extubation.  Saturations are greater than 90% on 2 L.  Chest x-ray done postop day #1 was stable.  The patient just had minimal  drainage from chest tubes and chest tubes discontinued in a normal  fashion.  Repeat chest x-ray remained stable with improved aeration.  The patient continued to use her incentive spirometer during her  postoperative course.  Unfortunately, over next several days with  ambulation the patient's O2 saturations were desaturating.  She was  dropping into the 80s.  Currently unable to wean off oxygen at this  point.  Will continue to ambulate and work on an pulmonary toilet in an  attempt to wean O2, but the patient may require home O2 at time of  discharge.  Postoperatively the patient was noted to be in normal sinus  rhythm.  Heart rate and blood pressure were stable.  She was able to be  weaned off all drips in normal fashion.  Swan-Ganz catheter  discontinued.  Heart rate stable and she was able to be started on a low-  dose beta blocker.  She tolerated this well.  The patient remained in  normal sinus rhythm during the remainder of her postop period.  The   patient did have mild acute blood loss anemia.  She was asymptomatic and  did not require any transfusions postoperatively.  Hemoglobin and  hematocrit were monitored and postop day #4 were stable at 10.2 and  29.5%.  Postoperatively the patient did have significant volume  overload.  She was started on Lasix.  The patient was  nearing baseline  prior to discharge home.  Diuretics were continued.  Postoperatively,  cardiac rehab was working with the patient.  She was progressing well.  She was ambulating with assistance prior to discharge home.  The patient  was tolerating diet well, no nausea or vomiting noted.   On postop day #4, May 25, 2007, she was afebrile.  She was in normal  sinus rhythm with heart rate in the 80s.  O2 saturation is 95% on 2 L.  Labs showed a white count 8.8, hemoglobin 10.2, hematocrit 29.5,  platelet count 233.  Sodium of 130, potassium 4.0, chloride of 96,  bicarb of 26, BUN of 11, creatinine 0.66, glucose of 99.  The patient is  in normal sinus rhythm.  Pulmonary status stable.  All incisions clean,  dry and intact and healing well.   FOLLOW-UP APPOINTMENTS:  A follow-up appointment has been arranged with  Dr. Tyrone Sage the see for Dr. Dorris Fetch on June 17, 2007, at 11:30 a.m.  The patient will need to obtain a PA and lateral chest x-ray 30 minutes  prior to this appointment.  The patient will need to follow up with Dr.  Reyes Ivan in 2 weeks.  She will need to contact Dr. Silva Bandy office to make  these arrangements.   ACTIVITY:  Patient instructed no driving until released to do so, no  heavy lifting over 10 pounds.  She is told to ambulate 3-4 times per  day, progress as tolerated, and to continue her breathing exercises.   DIET:  The patient educated on diet to be low-fat, low-salt.   INCISIONAL CARE:  The patient is told to shower, washing her incisions  using soap and water.  She is to contact the office if she develops any  drainage or opening from  any of her incision sites.   DISCHARGE MEDICATIONS:  1. Aspirin 325 mg daily.  2. Toprol XL 25 mg daily.  3. Lipitor 80 mg at night.  4. Lasix 40 mg daily x10 days.  5. Potassium chloride 20 mEq daily x10 days.  6. Oxycodone 5 mg 1-2 tablets q.4-6 h. p.r.n.      Theda Belfast, PA      Salvatore Decent. Dorris Fetch, M.D.  Electronically Signed   KMD/MEDQ  D:  05/25/2007  T:  05/26/2007  Job:  [id number]   cc:   Elmore Guise., M.D.

## 2010-07-30 NOTE — Assessment & Plan Note (Signed)
OFFICE VISIT   ATAVIA, POPPE  DOB:  09/08/1960                                        June 17, 2007  CHART #:  [id number]   The patient underwent coronary artery bypass grafting x5 on 05/21/2007  by Dr. Dorris Fetch.  She returns to the office today for a followup  visit.  Since surgery, she has not smoked anymore and seems determined  to stay off cigarettes.  She is increasing her physical activity  appropriately.  She has had no fever, chills, or evidence of congestive  heart failure.  She seems to be increasing her physical activity  appropriately.   EXAM:  Her blood pressure is 118/88.  Pulse is 90.  Respiratory rate is  18.  O2 sat is 98%.  Sternum is stable and well healed.  The right  endovein harvest site is also healing well.  She has no pedal edema.  Followup chest x-ray shows resolution of a left pleural effusion which  had been present postoperatively.   Overall, she has seen Dr. Reyes Ivan postoperatively and Plavix was added to  her medications.  She is currently on:  1. Aspirin 325 a day.  2. Toprol XL 25 a day.  3. Lipitor 80 mg a day.  4. Oxycodone p.r.n.  5. Plavix 75 mg a day.   We will talk to Dr. Silva Bandy office about changing her medications to as  many generics as possible to decrease her medication costs.  She notes  that the Lipitor is too expensive for her.  We will plan to see her back  p.r.n.   Sheliah Plane, MD  Electronically Signed   EG/MEDQ  D:  06/17/2007  T:  06/17/2007  Job:  [id number]   cc:   Elmore Guise., M.D.  Windle Guard, M.D.  Vernice Jefferson, MD  Salvatore Decent. Dorris Fetch, M.D.

## 2010-09-10 ENCOUNTER — Encounter: Payer: Self-pay | Admitting: Cardiology

## 2010-11-01 ENCOUNTER — Encounter: Payer: Self-pay | Admitting: Cardiology

## 2010-11-15 ENCOUNTER — Ambulatory Visit: Payer: Self-pay | Admitting: Cardiology

## 2010-12-06 LAB — COMPREHENSIVE METABOLIC PANEL
Albumin: 3.3 — ABNORMAL LOW
BUN: 7
Chloride: 104
Creatinine, Ser: 0.53
Total Bilirubin: 0.9

## 2010-12-06 LAB — URINE MICROSCOPIC-ADD ON

## 2010-12-06 LAB — CBC
HCT: 43.2
MCV: 84.8
Platelets: 266
RDW: 15.1
WBC: 11.3 — ABNORMAL HIGH

## 2010-12-06 LAB — URINALYSIS, ROUTINE W REFLEX MICROSCOPIC
Nitrite: NEGATIVE
Specific Gravity, Urine: 1.036 — ABNORMAL HIGH
pH: 5.5

## 2010-12-06 LAB — LIPASE, BLOOD: Lipase: 22

## 2010-12-06 LAB — DIFFERENTIAL
Basophils Absolute: 0.3 — ABNORMAL HIGH
Lymphocytes Relative: 20
Monocytes Absolute: 0.7
Neutro Abs: 7.9 — ABNORMAL HIGH
Neutrophils Relative %: 70

## 2010-12-09 LAB — BLOOD GAS, ARTERIAL
Bicarbonate: 22.9
O2 Saturation: 94.8
Patient temperature: 98.6
TCO2: 23.9
pH, Arterial: 7.458 — ABNORMAL HIGH

## 2010-12-09 LAB — BASIC METABOLIC PANEL
BUN: 11
BUN: 13
BUN: 14
BUN: 6
BUN: 7
BUN: 9
CO2: 23
CO2: 25
CO2: 25
CO2: 26
CO2: 26
CO2: 26
CO2: 29
Calcium: 7.7 — ABNORMAL LOW
Calcium: 8.2 — ABNORMAL LOW
Calcium: 8.3 — ABNORMAL LOW
Calcium: 8.5
Calcium: 8.8
Chloride: 101
Chloride: 106
Chloride: 98
Creatinine, Ser: 0.56
Creatinine, Ser: 0.64
Creatinine, Ser: 0.66
Creatinine, Ser: 0.74
GFR calc Af Amer: >60
GFR calc Af Amer: >60
GFR calc Af Amer: >60
GFR calc non Af Amer: >60
GFR calc non Af Amer: >60
GFR calc non Af Amer: >60
Glucose, Bld: 102 — ABNORMAL HIGH
Glucose, Bld: 117 — ABNORMAL HIGH
Glucose, Bld: 92
Glucose, Bld: 98
Potassium: 3.5
Potassium: 3.9
Potassium: 4
Potassium: 4.3
Sodium: 132 — ABNORMAL LOW
Sodium: 134 — ABNORMAL LOW
Sodium: 134 — ABNORMAL LOW
Sodium: 140

## 2010-12-09 LAB — CBC
HCT: 27.4 — ABNORMAL LOW
HCT: 27.8 — ABNORMAL LOW
HCT: 32.7 — ABNORMAL LOW
HCT: 34.6 — ABNORMAL LOW
HCT: 39.6
Hemoglobin: 11.1 — ABNORMAL LOW
Hemoglobin: 13.5
Hemoglobin: 9.3 — ABNORMAL LOW
Hemoglobin: 9.5 — ABNORMAL LOW
Hemoglobin: 9.5 — ABNORMAL LOW
MCHC: 34
MCHC: 34.1
MCHC: 34.1
MCHC: 34.2
MCHC: 34.4
MCHC: 34.4
MCHC: 34.7
MCHC: 34.8
MCV: 84.8
MCV: 85.1
MCV: 85.1
MCV: 85.4
MCV: 85.5
Platelets: 165
Platelets: 180
Platelets: 233
Platelets: 246
Platelets: 256
Platelets: 289
RBC: 3.17 — ABNORMAL LOW
RBC: 3.82 — ABNORMAL LOW
RBC: 4.07
RBC: 4.37
RBC: 4.66
RDW: 14.8
RDW: 15.2
RDW: 15.4
WBC: 10.4
WBC: 10.8 — ABNORMAL HIGH
WBC: 12.4 — ABNORMAL HIGH
WBC: 17.1 — ABNORMAL HIGH
WBC: 9.4

## 2010-12-09 LAB — ABO/RH: ABO/RH(D): O POS

## 2010-12-09 LAB — LIPID PANEL
Cholesterol: 167
HDL: 23 — ABNORMAL LOW
Total CHOL/HDL Ratio: 7.3

## 2010-12-09 LAB — PROTIME-INR: Prothrombin Time: 13.1

## 2010-12-09 LAB — COMPREHENSIVE METABOLIC PANEL
ALT: 13
AST: 40 — ABNORMAL HIGH
Alkaline Phosphatase: 97
CO2: 26
Chloride: 103
GFR calc Af Amer: >60
GFR calc non Af Amer: >60
Glucose, Bld: 105 — ABNORMAL HIGH
Potassium: 4.3
Sodium: 137
Total Bilirubin: 0.5

## 2010-12-09 LAB — TYPE AND SCREEN

## 2010-12-09 LAB — HEMOGLOBIN A1C
Hgb A1c MFr Bld: 5.8
Mean Plasma Glucose: 129

## 2010-12-09 LAB — POCT I-STAT 3, ART BLOOD GAS (G3+)
Acid-base deficit: 2
Acid-base deficit: 3 — ABNORMAL HIGH
Bicarbonate: 24.5 — ABNORMAL HIGH
Operator id: 252761
Operator id: 3402
Patient temperature: 35.9
TCO2: 26
pCO2 arterial: 38.7
pH, Arterial: 7.329 — ABNORMAL LOW
pH, Arterial: 7.357
pH, Arterial: 7.408 — ABNORMAL HIGH
pO2, Arterial: 75 — ABNORMAL LOW

## 2010-12-09 LAB — DIFFERENTIAL
Basophils Relative: 0
Eosinophils Absolute: 0.1
Eosinophils Absolute: 0.1
Lymphs Abs: 2.9
Monocytes Absolute: 0.7
Monocytes Relative: 6
Neutrophils Relative %: 69

## 2010-12-09 LAB — POCT CARDIAC MARKERS
Myoglobin, poc: 104
Operator id: 196461

## 2010-12-09 LAB — POCT I-STAT 4, (NA,K, GLUC, HGB,HCT)
Glucose, Bld: 108 — ABNORMAL HIGH
Glucose, Bld: 145 — ABNORMAL HIGH
Glucose, Bld: 148 — ABNORMAL HIGH
Glucose, Bld: 98
HCT: 27 — ABNORMAL LOW
Hemoglobin: 10.2 — ABNORMAL LOW
Hemoglobin: 8.8 — ABNORMAL LOW
Operator id: 252761
Operator id: 3291
Operator id: 3402
Operator id: 3402
Potassium: 4
Potassium: 4.1
Potassium: 4.1
Potassium: 5
Potassium: 5.2 — ABNORMAL HIGH
Sodium: 135
Sodium: 138
Sodium: 139

## 2010-12-09 LAB — I-STAT 8, (EC8 V) (CONVERTED LAB)
BUN: 9
Chloride: 102
HCT: 30 — ABNORMAL LOW
Operator id: 183861
pCO2, Ven: 41.1 — ABNORMAL LOW
pH, Ven: 7.406 — ABNORMAL HIGH

## 2010-12-09 LAB — CARDIAC PANEL(CRET KIN+CKTOT+MB+TROPI)
CK, MB: 57.9 — ABNORMAL HIGH
Total CK: 1121 — ABNORMAL HIGH
Total CK: 916 — ABNORMAL HIGH
Troponin I: 15.56

## 2010-12-09 LAB — CREATININE, SERUM: GFR calc Af Amer: >60

## 2010-12-09 LAB — PLATELET COUNT: Platelets: 265

## 2010-12-09 LAB — D-DIMER, QUANTITATIVE: D-Dimer, Quant: 1.32 — ABNORMAL HIGH

## 2010-12-09 LAB — APTT: aPTT: 34

## 2010-12-11 LAB — COMPREHENSIVE METABOLIC PANEL
ALT: 12
Albumin: 3 — ABNORMAL LOW
Alkaline Phosphatase: 95
BUN: 12
Chloride: 111
Potassium: 3.5
Sodium: 141
Total Bilirubin: 0.5
Total Protein: 6.5

## 2010-12-11 LAB — CBC
HCT: 35.3 — ABNORMAL LOW
HCT: 38.3
Hemoglobin: 13
MCHC: 35
MCV: 84.7
Platelets: 230
Platelets: 248
RDW: 15.8 — ABNORMAL HIGH
WBC: 7.2
WBC: 7.9

## 2010-12-11 LAB — T4, FREE: Free T4: 0.83 — ABNORMAL LOW

## 2010-12-11 LAB — LIPID PANEL
Cholesterol: 124
HDL: 20 — ABNORMAL LOW
Triglycerides: 158 — ABNORMAL HIGH

## 2010-12-11 LAB — DIFFERENTIAL
Basophils Absolute: 0.1
Basophils Relative: 1
Eosinophils Absolute: 0.1
Eosinophils Relative: 2
Monocytes Absolute: 0.5
Monocytes Relative: 7
Neutro Abs: 4.4

## 2010-12-11 LAB — TSH: TSH: 2.795

## 2010-12-11 LAB — CK TOTAL AND CKMB (NOT AT ARMC)
CK, MB: 1.2
Relative Index: INVALID
Total CK: 41

## 2010-12-11 LAB — PROTIME-INR
INR: 1
Prothrombin Time: 13.3

## 2010-12-11 LAB — CARDIAC PANEL(CRET KIN+CKTOT+MB+TROPI)
CK, MB: 1.2
Total CK: 43

## 2012-07-12 ENCOUNTER — Encounter: Payer: Self-pay | Admitting: Cardiology

## 2012-11-17 ENCOUNTER — Encounter (HOSPITAL_COMMUNITY): Payer: Self-pay | Admitting: *Deleted

## 2012-11-17 ENCOUNTER — Emergency Department (HOSPITAL_COMMUNITY): Payer: Medicaid Other

## 2012-11-17 ENCOUNTER — Emergency Department (HOSPITAL_COMMUNITY)
Admission: EM | Admit: 2012-11-17 | Discharge: 2012-11-17 | Disposition: A | Discharge status: Home / Self Care | Payer: Medicaid Other | Attending: Emergency Medicine | Admitting: Emergency Medicine

## 2012-11-17 DIAGNOSIS — Z951 Presence of aortocoronary bypass graft: Secondary | ICD-10-CM | POA: Insufficient documentation

## 2012-11-17 DIAGNOSIS — R110 Nausea: Secondary | ICD-10-CM | POA: Insufficient documentation

## 2012-11-17 DIAGNOSIS — Z79899 Other long term (current) drug therapy: Secondary | ICD-10-CM | POA: Insufficient documentation

## 2012-11-17 DIAGNOSIS — I10 Essential (primary) hypertension: Secondary | ICD-10-CM | POA: Insufficient documentation

## 2012-11-17 DIAGNOSIS — Z7982 Long term (current) use of aspirin: Secondary | ICD-10-CM | POA: Insufficient documentation

## 2012-11-17 DIAGNOSIS — E669 Obesity, unspecified: Secondary | ICD-10-CM | POA: Insufficient documentation

## 2012-11-17 DIAGNOSIS — I2510 Atherosclerotic heart disease of native coronary artery without angina pectoris: Secondary | ICD-10-CM | POA: Insufficient documentation

## 2012-11-17 DIAGNOSIS — I252 Old myocardial infarction: Secondary | ICD-10-CM | POA: Insufficient documentation

## 2012-11-17 DIAGNOSIS — K859 Acute pancreatitis without necrosis or infection, unspecified: Secondary | ICD-10-CM

## 2012-11-17 DIAGNOSIS — F17200 Nicotine dependence, unspecified, uncomplicated: Secondary | ICD-10-CM | POA: Insufficient documentation

## 2012-11-17 DIAGNOSIS — E785 Hyperlipidemia, unspecified: Secondary | ICD-10-CM | POA: Insufficient documentation

## 2012-11-17 HISTORY — DX: Acute pancreatitis without necrosis or infection, unspecified: K85.90

## 2012-11-17 LAB — COMPREHENSIVE METABOLIC PANEL
AST: 14 U/L (ref 0–37)
Albumin: 3.5 g/dL (ref 3.5–5.2)
Calcium: 9.2 mg/dL (ref 8.4–10.5)
Creatinine, Ser: 0.61 mg/dL (ref 0.50–1.10)
Sodium: 134 mEq/L — ABNORMAL LOW (ref 135–145)

## 2012-11-17 LAB — CBC WITH DIFFERENTIAL/PLATELET
Basophils Absolute: 0 10*3/uL (ref 0.0–0.1)
Basophils Relative: 0 % (ref 0–1)
Eosinophils Relative: 1 % (ref 0–5)
HCT: 41.2 % (ref 36.0–46.0)
MCHC: 36.2 g/dL — ABNORMAL HIGH (ref 30.0–36.0)
MCV: 85.1 fL (ref 78.0–100.0)
Monocytes Absolute: 0.6 10*3/uL (ref 0.1–1.0)
Neutro Abs: 6.1 10*3/uL (ref 1.7–7.7)
Platelets: 228 10*3/uL (ref 150–400)
RDW: 15.2 % (ref 11.5–15.5)

## 2012-11-17 LAB — URINALYSIS, ROUTINE W REFLEX MICROSCOPIC
Glucose, UA: NEGATIVE mg/dL
Leukocytes, UA: NEGATIVE
Specific Gravity, Urine: 1.023 (ref 1.005–1.030)
pH: 5.5 (ref 5.0–8.0)

## 2012-11-17 MED ORDER — HYDROMORPHONE HCL PF 1 MG/ML IJ SOLN
1.0000 mg | Freq: Once | INTRAMUSCULAR | Status: AC
  Administered 2012-11-17: 1 mg via INTRAVENOUS
  Filled 2012-11-17: qty 1

## 2012-11-17 MED ORDER — ONDANSETRON HCL 4 MG/2ML IJ SOLN
4.0000 mg | Freq: Once | INTRAMUSCULAR | Status: AC
  Administered 2012-11-17: 4 mg via INTRAVENOUS
  Filled 2012-11-17: qty 2

## 2012-11-17 MED ORDER — OXYCODONE-ACETAMINOPHEN 5-325 MG PO TABS: 2.0000 | ORAL_TABLET | ORAL | Status: DC | PRN

## 2012-11-17 MED ORDER — ONDANSETRON HCL 4 MG PO TABS: 4.0000 mg | ORAL_TABLET | Freq: Four times a day (QID) | ORAL | Status: DC

## 2012-11-17 NOTE — ED Notes (Signed)
Patient is alert and orientedx4.  Patient was explained discharge instructions and they understood them with no questions.  Arman Bogus, the patient's husband is taking the patient home.

## 2012-11-17 NOTE — ED Notes (Signed)
Pt is here with upper abdominal pain that started yesterday and she reports nausea.  Pt thinks it could be a pancreatitis flare up but pain does radiate through to back .

## 2012-11-17 NOTE — ED Notes (Signed)
Patient is resting comfortably. 

## 2012-11-22 NOTE — ED Provider Notes (Signed)
CSN: [id number]     Arrival date & time 11/17/12  1119 History   First MD Initiated Contact with Patient 11/17/12 1152     Chief Complaint  Patient presents with  . Abdominal Pain    HPI Pt is here with upper abdominal pain that started yesterday and she reports nausea. Pt thinks it could be a pancreatitis flare , pain does radiate through to back . Denies fever or vomiting.  Some nausea.  No ETOH or NSAID use.  Hx of pancreatitis in past.   Past Medical History  Diagnosis Date  . Arm pain, left   . Coronary artery disease   . Myocardial infarction 2009    underwent 5 vessel coronary bypass surgery  . Arrhythmia     she had postoperative atrial fibrillation which resolved  . Hyperlipidemia   . Hypertension   . Tobacco user   . Dyslipidemia   . Obesity   . GERD (gastroesophageal reflux disease)   . Pancreatitis    Past Surgical History  Procedure Laterality Date  . Coronary artery bypass graft  May 21, 2007    included a LIMA graft to LAD, sequential saphenous vein graft to the first and second obtuse marginal vessels--and sequential saphenous vein graft to the PDA and posterior lateral branches of the right coronary   No family history on file. History  Substance Use Topics  . Smoking status: Current Every Day Smoker -- 1.00 packs/day    Types: Cigarettes  . Smokeless tobacco: Not on file  . Alcohol Use: No   OB History   Grav Para Term Preterm Abortions TAB SAB Ect Mult Living                 Review of Systems  Constitutional: Negative for fever and chills.  Gastrointestinal: Negative for abdominal distention.  Genitourinary: Negative for flank pain.  All other systems reviewed and are negative.    Allergies  Niaspan  Home Medications   Current Outpatient Rx  Name  Route  Sig  Dispense  Refill  . aspirin EC 81 MG tablet   Oral   Take 81 mg by mouth daily.         Marland Kitchen lovastatin (MEVACOR) 40 MG tablet   Oral   Take 40 mg by mouth at bedtime.          . metoprolol (LOPRESSOR) 50 MG tablet   Oral   Take 50 mg by mouth 2 (two) times daily.         . ondansetron (ZOFRAN) 4 MG tablet   Oral   Take 1 tablet (4 mg total) by mouth every 6 (six) hours.   12 tablet   0   . oxyCODONE-acetaminophen (PERCOCET/ROXICET) 5-325 MG per tablet   Oral   Take 2 tablets by mouth every 4 (four) hours as needed for pain.   20 tablet   0    BP 146/77  Pulse 99  Temp(Src) 98 F (36.7 C) (Oral)  Resp 20  SpO2 95% Physical Exam  Nursing note and vitals reviewed. Constitutional: She is oriented to person, place, and time. She appears well-developed and well-nourished. No distress.  HENT:  Head: Normocephalic and atraumatic.  Eyes: Pupils are equal, round, and reactive to light.  Neck: Normal range of motion.  Cardiovascular: Normal rate and intact distal pulses.   Pulmonary/Chest: No respiratory distress.  Abdominal: Normal appearance. She exhibits no distension. There is tenderness in the periumbilical area. There is no rigidity, no rebound,  no guarding and no CVA tenderness. No hernia.  Musculoskeletal: Normal range of motion.  Neurological: She is alert and oriented to person, place, and time. No cranial nerve deficit.  Skin: Skin is warm and dry. No rash noted.  Psychiatric: She has a normal mood and affect. Her behavior is normal.    ED Course  Procedures (including critical care time)  Medications  ondansetron (ZOFRAN) injection 4 mg (4 mg Intravenous Given 11/17/12 1228)  HYDROmorphone (DILAUDID) injection 1 mg (1 mg Intravenous Given 11/17/12 1228)    Labs Review Labs Reviewed  CBC WITH DIFFERENTIAL - Abnormal; Notable for the following:    MCHC 36.2 (*)    All other components within normal limits  COMPREHENSIVE METABOLIC PANEL - Abnormal; Notable for the following:    Sodium 134 (*)    Glucose, Bld 122 (*)    All other components within normal limits  URINALYSIS, ROUTINE W REFLEX MICROSCOPIC - Abnormal; Notable for the  following:    Color, Urine AMBER (*)    APPearance CLOUDY (*)    Bilirubin Urine SMALL (*)    Ketones, ur 15 (*)    All other components within normal limits  LIPASE, BLOOD  POCT I-STAT TROPONIN I   Imaging Review No results found.  MDM   1. Pancreatitis    After treatment in the ED the patient feels back to baseline and wants to go home.    Nelia Shi, MD 11/22/12 1106

## 2013-05-18 ENCOUNTER — Encounter (INDEPENDENT_AMBULATORY_CARE_PROVIDER_SITE_OTHER): Payer: Self-pay

## 2013-05-18 ENCOUNTER — Ambulatory Visit (INDEPENDENT_AMBULATORY_CARE_PROVIDER_SITE_OTHER): Payer: Medicaid Other | Admitting: Cardiology

## 2013-05-18 ENCOUNTER — Encounter: Payer: Self-pay | Admitting: Cardiology

## 2013-05-18 VITALS — BP 133/79 | HR 59 | Ht 59.0 in | Wt 227.8 lb

## 2013-05-18 DIAGNOSIS — F17200 Nicotine dependence, unspecified, uncomplicated: Secondary | ICD-10-CM

## 2013-05-18 DIAGNOSIS — E785 Hyperlipidemia, unspecified: Secondary | ICD-10-CM

## 2013-05-18 DIAGNOSIS — I2510 Atherosclerotic heart disease of native coronary artery without angina pectoris: Secondary | ICD-10-CM

## 2013-05-18 DIAGNOSIS — Z720 Tobacco use: Secondary | ICD-10-CM | POA: Insufficient documentation

## 2013-05-18 DIAGNOSIS — I219 Acute myocardial infarction, unspecified: Secondary | ICD-10-CM

## 2013-05-18 DIAGNOSIS — I10 Essential (primary) hypertension: Secondary | ICD-10-CM | POA: Insufficient documentation

## 2013-05-18 NOTE — Patient Instructions (Signed)
We will schedule you for a nuclear stress test  Quit smoking  You may want to consider a stronger cholesterol lowering drug. Dr. Rex Kras can work with you on this  I will see you in one year.

## 2013-05-18 NOTE — Progress Notes (Signed)
April [name] Date of Birth: 1960/06/20 Medical Record #[id number]  History of Present Illness: April [name] is seen for reevaluation of CAD. She has a history of CAD and is s/p MI in 2009 with subsequent CABG by Dr. Roxan Hockey with LIMA to the LAD, SVG sequential to OM1 and OM2, and sequential SVG to PDA and PLOM. She had a nuclear stress test in 2011 which showed a fixed anteroapical infarct. No ischemia. EF 58%. She has been lost to follow up since then. She has a history of pancreatitis, COPD, HTN and hyperlipidemia. She has a history of tobacco abuse and is planning on quitting with the help of Wellbutrin. She does complain of intermittent sharp, stabbing pain above her left breast that lasts seconds. Her pain with her MI was in her back, shoulders and arms. She does have chronic dyspnea. She is morbidly obese. Her exercise is limited by a "catch" in her back. She is now on Medicaid and applying for disability.  Current Outpatient Prescriptions on File Prior to Visit  Medication Sig Dispense Refill  . aspirin EC 81 MG tablet Take 81 mg by mouth daily.      Marland Kitchen lovastatin (MEVACOR) 40 MG tablet Take 40 mg by mouth at bedtime.      . metoprolol (LOPRESSOR) 50 MG tablet Take 50 mg by mouth 2 (two) times daily.       No current facility-administered medications on file prior to visit.    Allergies  Allergen Reactions  . Niaspan [Niacin] Rash    Past Medical History  Diagnosis Date  . Arm pain, left   . Coronary artery disease   . Myocardial infarction 2009    underwent 5 vessel coronary bypass surgery  . Arrhythmia     she had postoperative atrial fibrillation which resolved  . Hyperlipidemia   . Hypertension   . Tobacco user   . Dyslipidemia   . Obesity   . GERD (gastroesophageal reflux disease)   . Pancreatitis   . Fatty liver   . COPD (chronic obstructive pulmonary disease)     Past Surgical History  Procedure Laterality Date  . Coronary artery bypass graft  May 21, 2007     included a LIMA graft to LAD, sequential saphenous vein graft to the first and second obtuse marginal vessels--and sequential saphenous vein graft to the PDA and posterior lateral branches of the right coronary    History  Smoking status  . Former Smoker -- 1.00 packs/day  . Types: Cigarettes  Smokeless tobacco  . Not on file    Comment: quit 05/17/2013    History  Alcohol Use No    Family History  Problem Relation Age of Onset  . Heart attack Mother   . Heart disease Mother   . Arrhythmia Father   . Heart disease Sister     stent    Review of Systems: As noted in HPI.  All other systems were reviewed and are negative.  Physical Exam: BP 133/79  Pulse 59  Ht 4\' 11"  (1.499 m)  Wt 227 lb 12.8 oz (103.329 kg)  BMI 45.99 kg/m2 Morbidly obese WF in NAD. HEENT: [place]/AT, PERRLA, EOMI. Oropharynx is clear. Neck: unable to assess JVD. No bruits. No adenopathy or thyromegaly. Lungs. Few wheezes CV: RRR. Normal S1-2. No gallop or murmur. Mild chest wall tenderness above left breast. Abd: obese, soft, NT. BS +. No masses. Extremities: No cyanosis or edema. Pulses 2+. Spider veins. Skin: warm and dry. Neuro: alert, oriented  x 3. No focal findings.  LABORATORY DATA: Lab Results  Component Value Date   WBC 9.1 11/17/2012   HGB 14.9 11/17/2012   HCT 41.2 11/17/2012   PLT 228 11/17/2012   GLUCOSE 122* 11/17/2012   CHOL  Value: 151        ATP III CLASSIFICATION:  <200     mg/dL   Desirable  200-239  mg/dL   Borderline High  >=240    mg/dL   High        10/18/2008   TRIG 124 10/18/2008   HDL 23* 10/18/2008   LDLCALC  Value: 103        Total Cholesterol/HDL:CHD Risk Coronary Heart Disease Risk Table                     Men   Women  1/2 Average Risk   3.4   3.3  Average Risk       5.0   4.4  2 X Average Risk   9.6   7.1  3 X Average Risk  23.4   11.0        Use the calculated Patient Ratio above and the CHD Risk Table to determine the patient's CHD Risk.        ATP III CLASSIFICATION (LDL):  <100      mg/dL   Optimal  100-129  mg/dL   Near or Above                    Optimal  130-159  mg/dL   Borderline  160-189  mg/dL   High  >190     mg/dL   Very High* 10/18/2008   ALT 7 11/17/2012   AST 14 11/17/2012   NA 134* 11/17/2012   K 3.6 11/17/2012   CL 99 11/17/2012   CREATININE 0.61 11/17/2012   BUN 9 11/17/2012   CO2 20 11/17/2012   TSH 1.832Test methodology is 3rd generation TSH 10/18/2008   INR 0.89 02/19/2010   HGBA1C  Value: 5.7 (NOTE) The ADA recommends the following therapeutic goal for glycemic control related to Hgb A1c measurement: Goal of therapy: <6.5 Hgb A1c  Reference: American Diabetes Association: Clinical Practice Recommendations 2010, Diabetes Care, 2010, 33: (Suppl  1). 10/18/2008   Ecg: 11/17/12: sinus tachy rate 107. Nonspecific ST-T changes.  Assessment / Plan: 1. CAD s/p MI and subsequent CABG in 2009. Some atypical chest pain. Will schedule for [facility]. Continue risk factor modification. Continue ASA, metoprolol, statin. If her stress test is stable I will follow up in one year.  2. Tobacco abuse. I have encouraged her efforts at smoking cessation.  3. HTN- controlled.  4. Dyslipidemia. I do not have any recent lipid levels. Now that she has Medicaid I would consider changing her to a more potent statin such as lipitor or crestor. Current guidelines would recommend higher potency with 40-80 mg of lipitor or 20 mg crestor. I will defer to her primary.  5. Morbid obesity  6. Recurrent pancreatitis.  7. COPD.

## 2013-06-01 ENCOUNTER — Ambulatory Visit (HOSPITAL_COMMUNITY): Payer: Medicaid Other | Attending: Cardiology | Admitting: Radiology

## 2013-06-01 VITALS — BP 108/69 | Ht 59.0 in | Wt 230.0 lb

## 2013-06-01 DIAGNOSIS — Z720 Tobacco use: Secondary | ICD-10-CM

## 2013-06-01 DIAGNOSIS — J449 Chronic obstructive pulmonary disease, unspecified: Secondary | ICD-10-CM | POA: Insufficient documentation

## 2013-06-01 DIAGNOSIS — R5383 Other fatigue: Secondary | ICD-10-CM

## 2013-06-01 DIAGNOSIS — R5381 Other malaise: Secondary | ICD-10-CM | POA: Insufficient documentation

## 2013-06-01 DIAGNOSIS — I10 Essential (primary) hypertension: Secondary | ICD-10-CM

## 2013-06-01 DIAGNOSIS — I219 Acute myocardial infarction, unspecified: Secondary | ICD-10-CM

## 2013-06-01 DIAGNOSIS — J4489 Other specified chronic obstructive pulmonary disease: Secondary | ICD-10-CM | POA: Insufficient documentation

## 2013-06-01 DIAGNOSIS — I2510 Atherosclerotic heart disease of native coronary artery without angina pectoris: Secondary | ICD-10-CM

## 2013-06-01 DIAGNOSIS — Z8249 Family history of ischemic heart disease and other diseases of the circulatory system: Secondary | ICD-10-CM | POA: Insufficient documentation

## 2013-06-01 DIAGNOSIS — E785 Hyperlipidemia, unspecified: Secondary | ICD-10-CM

## 2013-06-01 DIAGNOSIS — R079 Chest pain, unspecified: Secondary | ICD-10-CM

## 2013-06-01 DIAGNOSIS — R0609 Other forms of dyspnea: Secondary | ICD-10-CM | POA: Insufficient documentation

## 2013-06-01 DIAGNOSIS — R0602 Shortness of breath: Secondary | ICD-10-CM | POA: Insufficient documentation

## 2013-06-01 DIAGNOSIS — E669 Obesity, unspecified: Secondary | ICD-10-CM | POA: Insufficient documentation

## 2013-06-01 DIAGNOSIS — Z951 Presence of aortocoronary bypass graft: Secondary | ICD-10-CM | POA: Insufficient documentation

## 2013-06-01 DIAGNOSIS — F17200 Nicotine dependence, unspecified, uncomplicated: Secondary | ICD-10-CM | POA: Insufficient documentation

## 2013-06-01 DIAGNOSIS — I252 Old myocardial infarction: Secondary | ICD-10-CM | POA: Insufficient documentation

## 2013-06-01 DIAGNOSIS — R0989 Other specified symptoms and signs involving the circulatory and respiratory systems: Secondary | ICD-10-CM | POA: Insufficient documentation

## 2013-06-01 MED ORDER — TECHNETIUM TC 99M SESTAMIBI GENERIC - CARDIOLITE
33.0000 | Freq: Once | INTRAVENOUS | Status: AC | PRN
  Administered 2013-06-01: 33 via INTRAVENOUS

## 2013-06-01 MED ORDER — REGADENOSON 0.4 MG/5ML IV SOLN
0.4000 mg | Freq: Once | INTRAVENOUS | Status: AC
  Administered 2013-06-01: 0.4 mg via INTRAVENOUS

## 2013-06-01 NOTE — Progress Notes (Addendum)
[facility] [facility] [address] [place], [place] [address] [phone]    Cardiology Nuclear Med Study  April [name] is a 53 y.o. female     MRN : [id number]     DOB: [birth year]  Procedure Date: 06/01/2013  Nuclear Med Background Indication for Stress Test:  Evaluation for Ischemia History:  COPD and CAD, MI-CABG x5 2011 MPI: SCAR EF: 58% Cardiac Risk Factors: Family History - CAD, Hypertension, Obesity and Smoker  Symptoms:  Chest Pain, DOE, Fatigue and SOB   Nuclear Pre-Procedure Caffeine/Decaff Intake:  None NPO After: 6 pm   Lungs:  clear O2 Sat: 94% on room air. IV 0.9% NS with Angio Cath:  22g  IV Site: R Hand  IV Started by:  Crissie Figures, RN  Chest Size (in):  44 Cup Size: D  Height: 4\' 11"  (1.499 m)  Weight:  230 lb (104.327 kg)  BMI:  Body mass index is 46.43 kg/(m^2). Tech Comments:  No Rx this am    Nuclear Med Study 1 or 2 day study: 2 day  Stress Test Type:  Carlton Adam  Reading MD: n/a  Order Authorizing Provider:  S.Klein MD  Resting Radionuclide: Technetium 64m Sestamibi  Resting Radionuclide Dose: 33.0 mCi  06/02/13  Stress Radionuclide:  Technetium 71m Sestamibi  Stress Radionuclide Dose: 33.0 mCi  06/01/13          Stress Protocol Rest HR: 68 Stress HR: 88  Rest BP: 108/69 Stress BP: 126/81  Exercise Time (min): n/a METS: n/a   Predicted Max HR: 165 bpm % Max HR: 53.33 bpm Rate Pressure Product: 11088   Dose of Adenosine (mg):  n/a Dose of Lexiscan: 0.4 mg  Dose of Atropine (mg): n/a Dose of Dobutamine: n/a mcg/kg/min (at max HR)  Stress Test Technologist: Perrin Maltese, EMT-P  Nuclear Technologist:  Charlton Amor, CNMT     Rest Procedure:  Myocardial perfusion imaging was performed at rest 45 minutes following the intravenous administration of Technetium 28m Sestamibi. Rest ECG: NSR with T wave inversions in I, aVL, V1-V4  Stress Procedure:  The patient received IV Lexiscan 0.4 mg over 15-seconds.  Technetium 15m  Sestamibi injected at 30-seconds. This patient was lt.headed with the Lexiscan injection. Quantitative spect images were obtained after a 45 minute delay. Stress ECG: No significant change from baseline ECG  QPS Raw Data Images:  Mild diaphragmatic attenuation.  Normal left ventricular size. Stress Images:  There is a large area of reduced uptake in the mid and apical anterior walls, entire anterolateral wall and apex as well as apical inferolateral walls Rest Images:  There is a large area of reduced uptake in the mid and apical anterior walls, entire anterolateral wall and apex as well as apical inferolateral walls with a small area of reversibilty in the anterior wall Subtraction (SDS):  These findings are consistent with ischemia. Transient Ischemic Dilatation (Normal <1.22):  0.87 Lung/Heart Ratio (Normal <0.45):  0.22  Quantitative Gated Spect Images QGS EDV:  77 ml QGS ESV:  31 ml  Impression Exercise Capacity:  Lexiscan with no exercise. BP Response:  Normal blood pressure response. Clinical Symptoms:  No significant symptoms noted. ECG Impression:  No significant ST segment change suggestive of ischemia. Comparison with Prior Nuclear Study: No significant change from study of 2011  Overall Impression:  Intermediate risk stress nuclear study There is a large area of reduced uptake in the mid and apical anterior walls, entire anterolateral wall and apex as well  as apical inferolateral walls consistent with prior infarct with a very small area of reversibility in the mid anterior wall.  LV Ejection Fraction: 59%.  LV Wall Motion:  apical dyskinesis  Signed: Fransico Him, MD

## 2013-06-02 ENCOUNTER — Ambulatory Visit (HOSPITAL_COMMUNITY): Payer: Medicaid Other | Attending: Cardiology

## 2013-06-02 DIAGNOSIS — R0989 Other specified symptoms and signs involving the circulatory and respiratory systems: Secondary | ICD-10-CM

## 2013-06-02 MED ORDER — TECHNETIUM TC 99M SESTAMIBI GENERIC - CARDIOLITE
33.0000 | Freq: Once | INTRAVENOUS | Status: AC | PRN
  Administered 2013-06-02: 33 via INTRAVENOUS

## 2013-06-07 ENCOUNTER — Telehealth: Payer: Self-pay | Admitting: Cardiology

## 2013-06-07 NOTE — Telephone Encounter (Signed)
Returned call to patient April [name] results given.Advised to continue same medications and follow up with Dr.Jordan in 9/15.

## 2013-06-07 NOTE — Telephone Encounter (Signed)
New message  ° ° °Returning Cheryls call  °

## 2013-06-30 ENCOUNTER — Ambulatory Visit (INDEPENDENT_AMBULATORY_CARE_PROVIDER_SITE_OTHER): Payer: Medicaid Other | Admitting: Neurology

## 2013-06-30 ENCOUNTER — Encounter (INDEPENDENT_AMBULATORY_CARE_PROVIDER_SITE_OTHER): Payer: Self-pay

## 2013-06-30 ENCOUNTER — Encounter: Payer: Self-pay | Admitting: Neurology

## 2013-06-30 VITALS — BP 122/67 | HR 57 | Temp 97.7°F | Ht 59.0 in | Wt 231.0 lb

## 2013-06-30 DIAGNOSIS — Z951 Presence of aortocoronary bypass graft: Secondary | ICD-10-CM

## 2013-06-30 DIAGNOSIS — G4733 Obstructive sleep apnea (adult) (pediatric): Secondary | ICD-10-CM

## 2013-06-30 DIAGNOSIS — R400 Somnolence: Secondary | ICD-10-CM

## 2013-06-30 DIAGNOSIS — I519 Heart disease, unspecified: Secondary | ICD-10-CM

## 2013-06-30 DIAGNOSIS — G4710 Hypersomnia, unspecified: Secondary | ICD-10-CM

## 2013-06-30 DIAGNOSIS — R351 Nocturia: Secondary | ICD-10-CM

## 2013-06-30 NOTE — Progress Notes (Signed)
Subjective:    Patient ID: April [name] is a 53 y.o. female.  HPI    Star Age, MD, PhD [facility] [address] [place], [place] [address]  Dear Dr. Rex Kras,   I saw your patient, April [name], upon your kind request in my neurologic clinic today for initial consultation of her sleep disorder, in particular, concern for obstructive sleep apnea. The patient is unaccompanied today. As you know, April [name] is a 53 year old right-handed woman with an underlying medical history of hypertension, hyperlipidemia, ex-smoker (since 05/17/13!), COPD, morbid obesity, heart disease, status post MI and CABG (5 vessel in 2011), who reports snoring, excessive daytime sleepiness, and frequent nighttime awakenings. 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 She is going to see Dr. Lorin Mercy next week. She tries to sleep on her R side.   Her typical bedtime is reported to be around 11 to 11:30 PM and usual wake time is around 7 to 8 AM. Sleep onset typically occurs within a few minutes. She reports feeling poorly rested upon awakening. She wakes up on an average 10 to 15 times in the middle of the night and has to go to the bathroom 2 times on a typical night. She denies morning headaches. She wakes up with a dry mouth.  She reports excessive daytime somnolence (EDS) and Her Epworth Sleepiness Score (ESS) is 11/24 today. She has not fallen asleep while driving. The patient has not been taking a scheduled nap.  She has been known to snore for the past many years. Snoring is reportedly moderate, and associated with choking sounds and witnessed apneas. The patient admits to an occasional sense of choking or strangling feeling. There is report of nighttime reflux (takes prn OTC acid reducer by [employer]), with occasional nighttime cough experienced. The patient has not noted any RLS symptoms and is not known to kick while asleep or before falling asleep.  There is no family history of RLS or OSA.  She is a restless sleeper and in the morning, the bed is quite disheveled.   She denies cataplexy, sleep paralysis, hypnagogic or hypnopompic hallucinations, or sleep attacks. She does not report any vivid dreams, nightmares, dream enactments, or parasomnias, such as sleep talking or sleep walking. The patient has not had a sleep study or a home sleep test.  She consumes 3 caffeinated beverages per day, usually in the form of sodas, as late as 1 hour before bedtime.   Her bedroom is usually dark and cool. There is a TV in the bedroom and usually it is not on at night.   Her Past Medical History Is Significant For: Past Medical History  Diagnosis Date  . Arm pain, left   . Coronary artery disease   . Myocardial infarction 2009    underwent 5 vessel coronary bypass surgery  . Arrhythmia     she had postoperative atrial fibrillation which resolved  . Hyperlipidemia   . Hypertension   . Tobacco user   . Dyslipidemia   . Obesity   . GERD (gastroesophageal reflux disease)   . Pancreatitis   . Fatty liver   . COPD (chronic obstructive pulmonary disease)     Her Past Surgical History Is Significant For: Past Surgical History  Procedure Laterality Date  . Coronary artery bypass graft  May 21, 2007    included a LIMA graft to LAD, sequential saphenous vein graft to the first and second obtuse  marginal vessels--and sequential saphenous vein graft to the PDA and posterior lateral branches of the right coronary    Her Family History Is Significant For: Family History  Problem Relation Age of Onset  . Heart attack Mother   . Heart disease Mother   . Arrhythmia Father   . Heart disease Sister     stent    Her Social History Is Significant For: History   Social History  . Marital Status: Married    Spouse Name: N/A    Number of Children: 1  . Years of Education: N/A   Occupational History  . disabled [place] [employer]  Topics  . Smoking status: Former Smoker -- 1.00 packs/day    Types: Cigarettes  . Smokeless tobacco: None     Comment: quit 05/17/2013  . Alcohol Use: No  . Drug Use: No  . Sexual Activity: None   Other Topics Concern  . None   Social History Narrative  . None    Her Allergies Are:  Allergies  Allergen Reactions  . Niaspan [Niacin] Rash  :   Her Current Medications Are:  Outpatient Encounter Prescriptions as of 06/30/2013  Medication Sig  . aspirin EC 81 MG tablet Take 81 mg by mouth daily.  Marland Kitchen buPROPion (WELLBUTRIN SR) 100 MG 12 hr tablet Take 100 mg by mouth 2 (two) times daily.  Marland Kitchen lovastatin (MEVACOR) 40 MG tablet Take 40 mg by mouth at bedtime.  . metoprolol (LOPRESSOR) 50 MG tablet Take 50 mg by mouth 2 (two) times daily.  :  Review of Systems:  Out of a complete 14 point review of systems, all are reviewed and negative with the exception of these symptoms as listed below:   Review of Systems  Constitutional: Positive for fatigue and unexpected weight change (gain).  HENT: Positive for hearing loss and tinnitus.   Eyes: Positive for visual disturbance (blurred vision).  Respiratory: Positive for apnea (snoring) and shortness of breath.   Cardiovascular: Positive for chest pain.  Gastrointestinal: Positive for constipation.  Endocrine: Positive for heat intolerance.  Genitourinary: Negative.   Musculoskeletal: Positive for arthralgias.  Skin: Negative.   Allergic/Immunologic: Positive for environmental allergies.  Neurological: Negative.   Hematological: Negative.   Psychiatric/Behavioral: Positive for sleep disturbance (e.d.s., insomnia, snoring, restless leg). The patient is nervous/anxious.     Objective:  Neurologic Exam  Physical Exam Physical Examination:   Filed Vitals:   06/30/13 0905  BP: 122/67  Pulse: 57  Temp: 97.7 F (36.5 C)    General Examination: The patient is a very pleasant 53 y.o. female in no acute distress. She appears  well-developed and well-nourished and adequately groomed. She is obese.   HEENT: Normocephalic, atraumatic, pupils are equal, round and reactive to light and accommodation. Funduscopic exam is normal with sharp disc margins noted. Extraocular tracking is good without limitation to gaze excursion or nystagmus noted. Normal smooth pursuit is noted. Hearing is grossly intact. Tympanic membranes are clear bilaterally. Face is symmetric with normal facial animation and normal facial sensation. Speech is clear with no dysarthria noted. There is no hypophonia. There is no lip, neck/head, jaw or voice tremor. Neck is supple with full range of passive and active motion. There are no carotid bruits on auscultation. Oropharynx exam reveals: moderate mouth dryness, edentulous state, and moderate airway crowding, due to large tongue, larger uvula and tonsils in place. Mallampati is class III. Tongue protrudes centrally and palate elevates symmetrically. Tonsils are 1+ in size. Neck  size is 17 inches.   Chest: Clear to auscultation without wheezing, rhonchi or crackles noted.  Heart: S1+S2+0, regular and normal without murmurs, rubs or gallops noted.   Abdomen: Soft, non-tender and non-distended with normal bowel sounds appreciated on auscultation.  Extremities: There is no pitting edema in the distal lower extremities bilaterally. Pedal pulses are intact.  Skin: Warm and dry without trophic changes noted. There are no varicose veins.  Musculoskeletal: exam reveals no obvious joint deformities, tenderness or joint swelling or erythema.   Neurologically:  Mental status: The patient is awake, alert and oriented in all 4 spheres. Her immediate and remote memory, attention, language skills and fund of knowledge are appropriate. There is no evidence of aphasia, agnosia, apraxia or anomia. Speech is clear with normal prosody and enunciation. Thought process is linear. Mood is normal and affect is normal.  Cranial  nerves II - XII are as described above under HEENT exam. In addition: shoulder shrug is normal with equal shoulder height noted. Motor exam: Normal bulk, strength and tone is noted. There is no drift, tremor or rebound. Romberg is negative. Reflexes are 2+ throughout. Babinski: Toes are flexor bilaterally. Fine motor skills and coordination: intact with normal finger taps, normal hand movements, normal rapid alternating patting, normal foot taps and normal foot agility.  Cerebellar testing: No dysmetria or intention tremor on finger to nose testing. Heel to shin is unremarkable bilaterally. There is no truncal or gait ataxia.  Sensory exam: intact to light touch, pinprick, vibration, temperature sense and proprioception in the upper and lower extremities.  Gait, station and balance: She stands with mild difficulty, d/t low back pain. No veering to one side is noted. No leaning to one side is noted. Posture is age-appropriate and stance is narrow based. Gait shows normal stride length and normal pace. No problems turning are noted. She turns en bloc. Tandem walk is unremarkable. Intact toe and heel stance is noted.               Assessment and Plan:   In summary, Darlean TOMI GRANDPRE is a very pleasant 30 y.o.-year old female with an underlying medical history of hypertension, hyperlipidemia, ex-smoker (since 05/17/13!), COPD, morbid obesity, heart disease, status post MI and CABG (5 vessel in 2011), with a history and physical exam concerning for obstructive sleep apnea (OSA).  I had a long chat with the patient about my findings and the diagnosis of OSA, its prognosis and treatment options. We talked about medical treatments, surgical interventions and non-pharmacological approaches. I explained in particular the risks and ramifications of untreated moderate to severe OSA, especially with respect to developing cardiovascular disease down the Road, including congestive heart failure, difficult to treat hypertension,  cardiac arrhythmias, or stroke. Even type 2 diabetes has, in part, been linked to untreated OSA. Symptoms of untreated OSA include daytime sleepiness, memory problems, mood irritability and mood disorder such as depression and anxiety, lack of energy, as well as recurrent headaches, especially morning headaches. We talked about smoking cessation (she quit 1 month ago) and trying to maintain a healthy lifestyle in general, as well as the importance of weight control. I encouraged the patient to eat healthy, exercise daily and keep well hydrated, to keep a scheduled bedtime and wake time routine, to not skip any meals and eat healthy snacks in between meals. I advised the patient not to drive when feeling sleepy. I recommended the following at this time: sleep study with potential positive airway pressure titration.  I explained the sleep test procedure to the patient and also outlined possible surgical and non-surgical treatment options of OSA, including the use of a custom-made dental device (which would require a referral to a specialist dentist or oral surgeon), upper airway surgical options, such as pillar implants, radiofrequency surgery, tongue base surgery, and UPPP (which would involve a referral to an ENT surgeon). Rarely, jaw surgery such as mandibular advancement may be considered.  I also explained the CPAP treatment option to the patient, who indicated that she would be willing to try CPAP if the need arises. I explained the importance of being compliant with PAP treatment, not only for insurance purposes but primarily to improve Her symptoms, and for the patient's long term health benefit, including to reduce Her cardiovascular risks. I answered all her questions today and the patient was in agreement. I would like to see her back after the sleep study is completed and encouraged her to call with any interim questions, concerns, problems or updates.   Thank you very much for allowing me to  participate in the care of this nice patient. If I can be of any further assistance to you please do not hesitate to call me at [phone].  Sincerely,   Star Age, MD, PhD

## 2013-06-30 NOTE — Patient Instructions (Signed)

## 2013-07-13 ENCOUNTER — Other Ambulatory Visit: Payer: Self-pay | Admitting: Orthopaedic Surgery

## 2013-07-13 DIAGNOSIS — M4807 Spinal stenosis, lumbosacral region: Secondary | ICD-10-CM

## 2013-07-17 ENCOUNTER — Ambulatory Visit
Admission: RE | Admit: 2013-07-17 | Discharge: 2013-07-17 | Disposition: A | Discharge status: Home / Self Care | Payer: Medicaid Other | Source: Ambulatory Visit | Attending: Orthopaedic Surgery | Admitting: Orthopaedic Surgery

## 2013-07-17 DIAGNOSIS — M4807 Spinal stenosis, lumbosacral region: Secondary | ICD-10-CM

## 2013-08-09 ENCOUNTER — Encounter (INDEPENDENT_AMBULATORY_CARE_PROVIDER_SITE_OTHER): Payer: Medicaid Other | Admitting: Neurology

## 2013-08-09 DIAGNOSIS — R9431 Abnormal electrocardiogram [ECG] [EKG]: Secondary | ICD-10-CM

## 2013-08-09 DIAGNOSIS — R351 Nocturia: Secondary | ICD-10-CM

## 2013-08-09 DIAGNOSIS — Z951 Presence of aortocoronary bypass graft: Secondary | ICD-10-CM

## 2013-08-09 DIAGNOSIS — G4761 Periodic limb movement disorder: Secondary | ICD-10-CM

## 2013-08-09 DIAGNOSIS — I519 Heart disease, unspecified: Secondary | ICD-10-CM

## 2013-08-09 DIAGNOSIS — R400 Somnolence: Secondary | ICD-10-CM

## 2013-08-09 DIAGNOSIS — G4733 Obstructive sleep apnea (adult) (pediatric): Secondary | ICD-10-CM

## 2013-08-19 ENCOUNTER — Telehealth: Payer: Self-pay | Admitting: Neurology

## 2013-08-19 DIAGNOSIS — G4710 Hypersomnia, unspecified: Secondary | ICD-10-CM

## 2013-08-19 DIAGNOSIS — G4733 Obstructive sleep apnea (adult) (pediatric): Secondary | ICD-10-CM

## 2013-08-19 DIAGNOSIS — G4730 Sleep apnea, unspecified: Secondary | ICD-10-CM

## 2013-08-19 NOTE — Telephone Encounter (Signed)
Please call and notify patient that the recent sleep study confirmed the diagnosis of OSA. She did very well with CPAP during the study with significant improvement of the respiratory events. Therefore, I would like start the patient on CPAP at home. I placed the order in the chart.   Arrange for CPAP set up at home through a DME company of patient's choice and fax/route report to PCP and referring MD (if other than PCP).   The patient will also need a follow up appointment with me in 6-8 weeks post set up that has to be scheduled; help the patient schedule this (in a follow-up slot).   Please re-enforce the importance of compliance with treatment and the need for us to monitor compliance data.   Once you have spoken to the patient and scheduled the return appointment, you may close this encounter, thanks,   April Nault, MD, PhD [facility]    

## 2013-08-22 ENCOUNTER — Encounter: Payer: Self-pay | Admitting: *Deleted

## 2013-08-22 NOTE — Telephone Encounter (Signed)
I called and spoke with the patient about her recent sleep study results. I informed the patient that the study confirmed the diagnosis of obstructive sleep apnea and that she did well on CPAP during the night of her study with significant improvement of her respiratory events. Dr. Rexene Alberts recommend starting CPAP therapy at home, I will send the order to [place] who will contact the patient. I will fax Dr. Jeneen Rinks Little's office a copy of the report and mail a copy to the patient along with a follow up instruction letter.

## 2013-10-04 ENCOUNTER — Encounter: Payer: Self-pay | Admitting: Neurology

## 2013-10-25 NOTE — Progress Notes (Signed)
Quick Note:  I reviewed the patient's CPAP compliance data from 08/31/2013 to 10/03/2013, which is a total of 34 days, during which time the patient used CPAP every day except for 1 day. The average usage for all days was 6 hours and 28 minutes. The percent used days greater than 4 hours was 97 %, indicating excellent compliance. The residual AHI was low at 0.2 per hour, indicating an appropriate treatment pressure of 8 cwp with EPR of 2. Air leak from the mask was acceptable at 21.3 L per minute at the 95th percentile. I will review this data with the patient at the next office visit, which is currently routinely scheduled for 10/31/2013 at 2 PM, provide feedback and additional troubleshooting if need be.  Star Age, MD, PhD [facility]   ______

## 2013-10-31 ENCOUNTER — Ambulatory Visit: Payer: Medicaid Other | Admitting: Neurology

## 2013-11-03 ENCOUNTER — Ambulatory Visit (INDEPENDENT_AMBULATORY_CARE_PROVIDER_SITE_OTHER): Payer: Medicaid Other | Admitting: Neurology

## 2013-11-03 ENCOUNTER — Encounter: Payer: Self-pay | Admitting: Neurology

## 2013-11-03 VITALS — BP 119/74 | HR 59 | Temp 98.1°F | Ht 61.0 in | Wt 238.0 lb

## 2013-11-03 DIAGNOSIS — G4733 Obstructive sleep apnea (adult) (pediatric): Secondary | ICD-10-CM

## 2013-11-03 DIAGNOSIS — Z951 Presence of aortocoronary bypass graft: Secondary | ICD-10-CM

## 2013-11-03 NOTE — Progress Notes (Signed)
Subjective:    Patient ID: April [name] is a 53 y.o. female.  HPI    Interim history:   April [name] is a 53 year old right-handed woman with an underlying medical history of hypertension, hyperlipidemia, ex-smoker (since 05/17/13), COPD, morbid obesity, heart disease, status post MI and CABG (5 vessel in 2011), who presents for followup consultation of her OSA. The patient is unaccompanied today. I first met her on 06/30/2013 at the request of her primary care physician, at which time she reported snoring, excessive daytime somnolence and frequent nighttime awakenings but also arthritic pain in her left shoulder which was bothersome at night. I suggested she return for sleep study. She has split-night sleep study on 08/09/2013 and went over her test results in detail today. Her baseline sleep efficiency was mildly reduced at 86.3% with a latency to sleep of 16 minutes and wake after sleep onset of 5 minutes with multiple moderate sleep fragmentation noted. She had a markedly increased percentage of stage II sleep, absence of slow-wave sleep, and a reduced percentage of REM sleep with a mildly reduced REM latency. She had called snoring. She slept only on the right side. Her total AHI was 31.2 per hour. Baseline oxygen saturation was 90%, nadir was 76% in REM sleep. She was then titrated on CPAP from 5-8 cm. she achieved a markedly increased percentage of REM sleep at 41.8%. Average oxygen saturation was 92%, nadir was 86%. Snoring was eliminated. She has no significant PLMS before or after CPAP. She did have an AHI of 0 per hour on the final pressure.  I reviewed the patient's CPAP compliance data from 08/31/2013 to 10/03/2013, which is a total of 34 days, during which time the patient used CPAP every day except for 1 day. The average usage for all days was 6 hours and 28 minutes. The percent used days greater than 4 hours was 97 %, indicating excellent compliance. The residual AHI was low at 0.2 per hour,  indicating an appropriate treatment pressure of 8 cwp with EPR of 2. Air leak from the mask was acceptable at 21.3 L per minute at the 95th percentile.  Today, I reviewed her compliance data from 10/04/2013 through 11/02/2013 which is a total of 30 days during which time she used her machine every day except for 2 nights. Percent used days greater than 4 hours was 93%, indicating excellent compliance, residual AHI low at 0.3/h and leak acceptable at 20 lpm.  Today, she reports that she does not sleep very well, as she has to sleep on her side and the mask pushes into the side of her face. She does endorse, her sleep quality though is much better. Her nocturia is better. Her EDS is better. She is trying to lose weight, essentially eliminated sodas and has been able to stop smoking.   Her Past Medical History Is Significant For: Past Medical History  Diagnosis Date  . Arm pain, left   . Coronary artery disease   . Myocardial infarction 2009    underwent 5 vessel coronary bypass surgery  . Arrhythmia     she had postoperative atrial fibrillation which resolved  . Hyperlipidemia   . Hypertension   . Tobacco user   . Dyslipidemia   . Obesity   . GERD (gastroesophageal reflux disease)   . Pancreatitis   . Fatty liver   . COPD (chronic obstructive pulmonary disease)     Her Past Surgical History Is Significant For: Past Surgical History  Procedure Laterality  Date  . Coronary artery bypass graft  May 21, 2007    included a LIMA graft to LAD, sequential saphenous vein graft to the first and second obtuse marginal vessels--and sequential saphenous vein graft to the PDA and posterior lateral branches of the right coronary    Her Family History Is Significant For: Family History  Problem Relation Age of Onset  . Heart attack Mother   . Heart disease Mother   . Arrhythmia Father   . Heart disease Sister     stent    Her Social History Is Significant For: History   Social History  .  Marital Status: Married    Spouse Name: April [name]    Number of Children: 1  . Years of Education: 10   Occupational History  .      applying for disability   Social History Main Topics  . Smoking status: Former Smoker -- 1.00 packs/day    Types: Cigarettes  . Smokeless tobacco: Never Used     Comment: quit 05/17/2013  . Alcohol Use: No  . Drug Use: No  . Sexual Activity: None   Other Topics Concern  . None   Social History Narrative   Patient is left handed and resides with husband    Her Allergies Are:  Allergies  Allergen Reactions  . Niaspan [Niacin] Rash  :   Her Current Medications Are:  Outpatient Encounter Prescriptions as of 11/03/2013  Medication Sig  . aspirin EC 81 MG tablet Take 81 mg by mouth daily.  Marland Kitchen buPROPion (WELLBUTRIN SR) 100 MG 12 hr tablet Take 100 mg by mouth 2 (two) times daily.  Marland Kitchen lovastatin (MEVACOR) 40 MG tablet Take 40 mg by mouth at bedtime.  . metoprolol (LOPRESSOR) 50 MG tablet Take 50 mg by mouth 2 (two) times daily.  :  Review of Systems:  Out of a complete 14 point review of systems, all are reviewed and negative with the exception of these symptoms as listed below:   Review of Systems  All other systems reviewed and are negative.   Objective:  Neurologic Exam  Physical Exam Physical Examination:   Filed Vitals:   11/03/13 1044  BP: 119/74  Pulse: 59  Temp: 98.1 F (36.7 C)   General Examination: The patient is a very pleasant 53 y.o. female in no acute distress. She appears well-developed and well-nourished and adequately groomed. She is obese.   HEENT: Normocephalic, atraumatic, pupils are equal, round and reactive to light and accommodation. Funduscopic exam is normal with sharp disc margins noted. Extraocular tracking is good without limitation to gaze excursion or nystagmus noted. Normal smooth pursuit is noted. Hearing is grossly intact. Face is symmetric with normal facial animation and normal facial sensation. Speech is  clear with no dysarthria noted. There is no hypophonia. There is no lip, neck/head, jaw or voice tremor. Neck is supple with full range of passive and active motion. There are no carotid bruits on auscultation. Oropharynx exam reveals: moderate mouth dryness, edentulous state, and moderate airway crowding, due to large tongue, larger uvula and tonsils in place. Mallampati is class III. Tongue protrudes centrally and palate elevates symmetrically. Tonsils are 1+ in size.  Chest: Clear to auscultation without wheezing, rhonchi or crackles noted.  Heart: S1+S2+0, regular and normal without murmurs, rubs or gallops noted.   Abdomen: Soft, non-tender and non-distended with normal bowel sounds appreciated on auscultation.  Extremities: There is trace edema in the distal lower extremities bilaterally. Pedal pulses are intact.  Skin: Warm and dry without trophic changes noted. There are no varicose veins.  Musculoskeletal: exam reveals no obvious joint deformities, tenderness or joint swelling or erythema.   Neurologically:  Mental status: The patient is awake, alert and oriented in all 4 spheres. Her immediate and remote memory, attention, language skills and fund of knowledge are appropriate. There is no evidence of aphasia, agnosia, apraxia or anomia. Speech is clear with normal prosody and enunciation. Thought process is linear. Mood is normal and affect is normal.  Cranial nerves II - XII are as described above under HEENT exam. In addition: shoulder shrug is normal with equal shoulder height noted. Motor exam: Normal bulk, strength and tone is noted. There is no drift, tremor or rebound. Romberg is negative. Reflexes are 2+ throughout. Babinski: Toes are flexor bilaterally. Fine motor skills and coordination: intact with normal finger taps, normal hand movements, normal rapid alternating patting, normal foot taps and normal foot agility.  Cerebellar testing: No dysmetria or intention tremor on finger  to nose testing. There is no truncal or gait ataxia.  Sensory exam: intact to light touch, pinprick, vibration, temperature sense and proprioception in the upper and lower extremities.  Gait, station and balance: She stands with mild difficulty, d/t low back pain. No veering to one side is noted. No leaning to one side is noted. Posture is age-appropriate and stance is narrow based. Gait shows normal stride length and normal pace. No problems turning are noted. She turns en bloc. Tandem walk is unremarkable.               Assessment and Plan:   In summary, Taquisha KASSIA DEMARINIS is a very pleasant 53 y.o.-year old female with an underlying medical history of hypertension, hyperlipidemia, ex-smoker (since 05/17/13), COPD, morbid obesity, heart disease, status post MI and CABG (5 vessel in 2011), who presents for followup consultation of her OSA. I went over her test results in detail today and also explained her compliance data to her. She is commended for her great compliance and encouraged her to continue to use CPAP regularly to help reduce cardiovascular risk. She is commended for her weight loss and for her smoking cessation. Overall, she feels improved in her daytime somnolence, the quality of worse, and her nocturia but still has some trouble tolerating the mask on her face especially since she is a side sleeper and also likes to sleep with her hand under her cheek. She may do better with a nasal pillows mask and says that she would like to go back to trying that next time she is up for a new mask. She has some trouble maintaining sleep and this is not a new problem. Years ago she tried melatonin. I would rather she try something over-the-counter than the prescription sleep aid if we can. I suggested she try melatonin 3-5 mg, one to 2 hours before her scheduled bedtime. She make go up to 10 mg if needed. We again talked about trying to maintaining a healthy lifestyle in general. I encouraged the patient to eat  healthy, exercise daily and keep well hydrated, to keep a scheduled bedtime and wake time routine, to not skip any meals and eat healthy snacks in between meals and to have protein with every meal. I stressed the importance of regular exercise.   I answered all her questions today and the patient was in agreement with the above outlined plan. I would like to see the patient back in 6 months, sooner if the  need arises and encouraged her to call with any interim questions, concerns, problems or updates.

## 2013-11-03 NOTE — Patient Instructions (Signed)
  You can try Melatonin at night for sleep: take 3 to 5 mg one to 2 hours before your bedtime. You may go up to 10 mg if needed.  Please continue using your CPAP regularly. While your insurance requires that you use CPAP at least 4 hours each night on 70% of the nights, I recommend, that you not skip any nights and use it throughout the night if you can. Getting used to CPAP and staying with the treatment long term does take time and patience and discipline. Untreated obstructive sleep apnea when it is moderate to severe can have an adverse impact on cardiovascular health and raise her risk for heart disease, arrhythmias, hypertension, congestive heart failure, stroke and diabetes. Untreated obstructive sleep apnea causes sleep disruption, nonrestorative sleep, and sleep deprivation. This can have an impact on your day to day functioning and cause daytime sleepiness and impairment of cognitive function, memory loss, mood disturbance, and problems focussing. Using CPAP regularly can improve these symptoms.  Keep up the good work! I will see you back in 6 months for sleep apnea check up, and if you continue to do well on CPAP I will see you once a year thereafter.   Try the nasal pillows again and you may want to alternate between the pillows and the nose mask.

## 2013-11-24 ENCOUNTER — Ambulatory Visit (INDEPENDENT_AMBULATORY_CARE_PROVIDER_SITE_OTHER): Payer: Medicaid Other | Admitting: Cardiology

## 2013-11-24 ENCOUNTER — Encounter: Payer: Self-pay | Admitting: Cardiology

## 2013-11-24 VITALS — BP 138/72 | HR 54 | Ht 59.0 in | Wt 239.8 lb

## 2013-11-24 DIAGNOSIS — Z951 Presence of aortocoronary bypass graft: Secondary | ICD-10-CM

## 2013-11-24 DIAGNOSIS — I2510 Atherosclerotic heart disease of native coronary artery without angina pectoris: Secondary | ICD-10-CM

## 2013-11-24 DIAGNOSIS — I209 Angina pectoris, unspecified: Secondary | ICD-10-CM

## 2013-11-24 DIAGNOSIS — I25119 Atherosclerotic heart disease of native coronary artery with unspecified angina pectoris: Secondary | ICD-10-CM

## 2013-11-24 DIAGNOSIS — I252 Old myocardial infarction: Secondary | ICD-10-CM

## 2013-11-24 DIAGNOSIS — I10 Essential (primary) hypertension: Secondary | ICD-10-CM

## 2013-11-24 DIAGNOSIS — E785 Hyperlipidemia, unspecified: Secondary | ICD-10-CM

## 2013-11-24 NOTE — Patient Instructions (Signed)
You need to focus on weight loss. Avoid carbohydrates.  Congratulations on quitting smoking  Continue your current therapy  I will see you in 6 months.

## 2013-11-24 NOTE — Progress Notes (Signed)
April [name] Date of Birth: [birth year] Medical Record #[id number]  History of Present Illness: April [name] is seen for reevaluation of CAD. She has a history of CAD and is s/p MI in 2009 with subsequent CABG by Dr. Roxan Hockey with LIMA to the LAD, SVG sequential to OM1 and OM2, and sequential SVG to PDA and PLOM. She had a nuclear stress test in March 2015 which showed anterolateral and apical infarct without ischemia. EF 59%. This was unchanged since 2011.  She has a history of pancreatitis, COPD, HTN and hyperlipidemia. She has a history of tobacco abuse. She quit smoking in March.  She is morbidly obese. Her exercise is limited by back pain. Her only chest pain is when she raises her arms over her head. She has occasional dyspnea.   Current Outpatient Prescriptions on File Prior to Visit  Medication Sig Dispense Refill  . aspirin EC 81 MG tablet Take 81 mg by mouth daily.      Marland Kitchen buPROPion (WELLBUTRIN SR) 100 MG 12 hr tablet Take 100 mg by mouth 2 (two) times daily.      Marland Kitchen lovastatin (MEVACOR) 40 MG tablet Take 40 mg by mouth at bedtime.      . metoprolol (LOPRESSOR) 50 MG tablet Take 50 mg by mouth 2 (two) times daily.       No current facility-administered medications on file prior to visit.    Allergies  Allergen Reactions  . Niaspan [Niacin] Rash    Past Medical History  Diagnosis Date  . Arm pain, left   . Coronary artery disease   . Myocardial infarction 2009    underwent 5 vessel coronary bypass surgery  . Arrhythmia     she had postoperative atrial fibrillation which resolved  . Hyperlipidemia   . Hypertension   . Tobacco user   . Dyslipidemia   . Obesity   . GERD (gastroesophageal reflux disease)   . Pancreatitis   . Fatty liver   . COPD (chronic obstructive pulmonary disease)     Past Surgical History  Procedure Laterality Date  . Coronary artery bypass graft  May 21, 2007    included a LIMA graft to LAD, sequential saphenous vein graft to the first and  second obtuse marginal vessels--and sequential saphenous vein graft to the PDA and posterior lateral branches of the right coronary    History  Smoking status  . Former Smoker -- 1.00 packs/day  . Types: Cigarettes  Smokeless tobacco  . Never Used    Comment: quit 05/17/2013    History  Alcohol Use No    Family History  Problem Relation Age of Onset  . Heart attack Mother   . Heart disease Mother   . Arrhythmia Father   . Heart disease Sister     stent    Review of Systems: As noted in HPI.  All other systems were reviewed and are negative.  Physical Exam: BP 138/72  Pulse 54  Ht 4\' 11"  (1.499 m)  Wt 239 lb 12.8 oz (108.773 kg)  BMI 48.41 kg/m2 Morbidly obese WF in NAD. HEENT: [place]/AT, PERRLA, EOMI. Oropharynx is clear. Neck: unable to assess JVD. No bruits. No adenopathy or thyromegaly. Lungs. Few wheezes CV: RRR. Normal S1-2. No gallop or murmur.  Abd: obese, soft, NT. BS +. No masses. Extremities: No cyanosis or edema. Pulses 2+. Spider veins. Skin: warm and dry. Neuro: alert, oriented x 3. No focal findings.  LABORATORY DATA: Lab Results  Component Value Date  WBC 9.1 11/17/2012   HGB 14.9 11/17/2012   HCT 41.2 11/17/2012   PLT 228 11/17/2012   GLUCOSE 122* 11/17/2012   CHOL  Value: 151        ATP III CLASSIFICATION:  <200     mg/dL   Desirable  200-239  mg/dL   Borderline High  >=240    mg/dL   High        10/18/2008   TRIG 124 10/18/2008   HDL 23* 10/18/2008   LDLCALC  Value: 103        Total Cholesterol/HDL:CHD Risk Coronary Heart Disease Risk Table                     Men   Women  1/2 Average Risk   3.4   3.3  Average Risk       5.0   4.4  2 X Average Risk   9.6   7.1  3 X Average Risk  23.4   11.0        Use the calculated Patient Ratio above and the CHD Risk Table to determine the patient's CHD Risk.        ATP III CLASSIFICATION (LDL):  <100     mg/dL   Optimal  100-129  mg/dL   Near or Above                    Optimal  130-159  mg/dL   Borderline  160-189  mg/dL   High   >190     mg/dL   Very High* 10/18/2008   ALT 7 11/17/2012   AST 14 11/17/2012   NA 134* 11/17/2012   K 3.6 11/17/2012   CL 99 11/17/2012   CREATININE 0.61 11/17/2012   BUN 9 11/17/2012   CO2 20 11/17/2012   TSH 1.832Test methodology is 3rd generation TSH 10/18/2008   INR 0.89 02/19/2010   HGBA1C  Value: 5.7 (NOTE) The ADA recommends the following therapeutic goal for glycemic control related to Hgb A1c measurement: Goal of therapy: <6.5 Hgb A1c  Reference: American Diabetes Association: Clinical Practice Recommendations 2010, Diabetes Care, 2010, 33: (Suppl  1). 10/18/2008   Ecg: 11/17/12: sinus brady. Nonspecific T changes.  Labs from Dr. Eddie Dibbles office: chol-155, trig- 299  Assessment / Plan: 1. CAD s/p MI and subsequent CABG in 2009.  Lexiscan Myoview in March showed evidence of prior MI without ischemia. EF 59%. Continue risk factor modification. Continue ASA, metoprolol, statin.  2. Tobacco abuse. I have congratulated her on smoking cessation.  3. HTN- controlled.  4. Dyslipidemia. High triglycerides. This limits accuracy of cholesterol level. Needs to focus on weight loss with low carb diet.  5. Morbid obesity  6. Recurrent pancreatitis.  7. COPD.

## 2013-12-01 ENCOUNTER — Encounter: Payer: Self-pay | Admitting: Neurology

## 2013-12-02 NOTE — Progress Notes (Signed)
Quick Note:  I reviewed the patient's CPAP compliance data from 11/01/2013 to 11/30/2013, which is a total of 30 days, during which time the patient used CPAP every day except for 1 day. The average usage for all days was 5 hours and 55 minutes. The percent used days greater than 4 hours was 97 %, indicating excellent compliance. The residual AHI was 0.2 per hour, indicating an appropriate treatment pressure of 8 cwp with EPR of 2. Air leak from the mask was at times high with the 95th percentile at 27.2 L per minute. She may benefit from a mask refit or a new mask.  I will review this data with the patient at the next office visit, which is currently routinely scheduled for 05/08/2014 at 12 PM, provide feedback and additional troubleshooting if need be.  Star Age, MD, PhD [facility] ([facility]   ______

## 2013-12-30 NOTE — Progress Notes (Signed)
Quick Note:  I reviewed the patient's CPAP compliance data from 11/01/2013 to 11/30/2013, which is a total of 30 days, during which time the patient used CPAP every day except for 1 day. The average usage for all days was 5 hours and 55 minutes. The percent used days greater than 4 hours was 97 %, indicating excellent compliance. The residual AHI was low at 0.2 per hour, indicating an appropriate treatment pressure of 8 cwp with EPR of 2. Air leak from the mask was at times high with the 95th percentile at 27.2 L per minute. I will review this data with the patient at the next office visit, which is currently routinely scheduled for 05/08/2014 at 12 PM, provide feedback and additional troubleshooting if need be.  Star Age, MD, PhD [facility]   ______

## 2014-04-19 ENCOUNTER — Telehealth: Payer: Self-pay | Admitting: Neurology

## 2014-04-19 NOTE — Telephone Encounter (Signed)
Called patient to reschedule appointment from 05/08/14,left Vm message to return call back

## 2014-05-08 ENCOUNTER — Ambulatory Visit: Payer: Medicaid Other | Admitting: Neurology

## 2014-05-25 ENCOUNTER — Encounter: Payer: Self-pay | Admitting: Cardiology

## 2014-05-25 ENCOUNTER — Ambulatory Visit (INDEPENDENT_AMBULATORY_CARE_PROVIDER_SITE_OTHER): Payer: Medicaid Other | Admitting: Cardiology

## 2014-05-25 VITALS — BP 138/70 | HR 82 | Ht 59.0 in | Wt 251.9 lb

## 2014-05-25 DIAGNOSIS — J449 Chronic obstructive pulmonary disease, unspecified: Secondary | ICD-10-CM | POA: Insufficient documentation

## 2014-05-25 DIAGNOSIS — I10 Essential (primary) hypertension: Secondary | ICD-10-CM

## 2014-05-25 DIAGNOSIS — R0609 Other forms of dyspnea: Secondary | ICD-10-CM | POA: Insufficient documentation

## 2014-05-25 DIAGNOSIS — Z951 Presence of aortocoronary bypass graft: Secondary | ICD-10-CM

## 2014-05-25 DIAGNOSIS — Z720 Tobacco use: Secondary | ICD-10-CM

## 2014-05-25 DIAGNOSIS — I25119 Atherosclerotic heart disease of native coronary artery with unspecified angina pectoris: Secondary | ICD-10-CM

## 2014-05-25 DIAGNOSIS — E785 Hyperlipidemia, unspecified: Secondary | ICD-10-CM

## 2014-05-25 LAB — CBC WITH DIFFERENTIAL/PLATELET
BASOS ABS: 0 10*3/uL (ref 0.0–0.1)
Basophils Relative: 0 % (ref 0–1)
Eosinophils Absolute: 0.1 10*3/uL (ref 0.0–0.7)
Eosinophils Relative: 1 % (ref 0–5)
HCT: 40.9 % (ref 36.0–46.0)
Hemoglobin: 13.4 g/dL (ref 12.0–15.0)
Lymphocytes Relative: 40 % (ref 12–46)
Lymphs Abs: 2.4 10*3/uL (ref 0.7–4.0)
MCH: 29.5 pg (ref 26.0–34.0)
MCHC: 32.8 g/dL (ref 30.0–36.0)
MCV: 90.1 fL (ref 78.0–100.0)
MPV: 9.9 fL (ref 8.6–12.4)
Monocytes Absolute: 0.4 10*3/uL (ref 0.1–1.0)
Monocytes Relative: 7 % (ref 3–12)
NEUTROS PCT: 52 % (ref 43–77)
Neutro Abs: 3.2 10*3/uL (ref 1.7–7.7)
Platelets: 227 10*3/uL (ref 150–400)
RBC: 4.54 MIL/uL (ref 3.87–5.11)
RDW: 15.4 % (ref 11.5–15.5)
WBC: 6.1 10*3/uL (ref 4.0–10.5)

## 2014-05-25 LAB — BASIC METABOLIC PANEL
BUN: 18 mg/dL (ref 6–23)
CALCIUM: 8.9 mg/dL (ref 8.4–10.5)
CHLORIDE: 108 meq/L (ref 96–112)
CO2: 19 meq/L (ref 19–32)
Creat: 0.76 mg/dL (ref 0.50–1.10)
Glucose, Bld: 100 mg/dL — ABNORMAL HIGH (ref 70–99)
Potassium: 4.3 mEq/L (ref 3.5–5.3)
SODIUM: 138 meq/L (ref 135–145)

## 2014-05-25 NOTE — Patient Instructions (Signed)
We will schedule you for blood work today and request a copy of your last lab work with Dr. Rex Kras.  We will schedule you for pulmonary function tests and an Echocardiogram  These tests will help [place] determine the cause of your SOB.

## 2014-05-25 NOTE — Progress Notes (Signed)
April [name] Date of Birth: 1960-12-17 Medical Record #[id number]  History of Present Illness: [name] is seen for evaluation of dyspnea. She has a history of CAD and is s/p MI in 2009 with subsequent CABG by Dr. Roxan Hockey with LIMA to the LAD, SVG sequential to OM1 and OM2, and sequential SVG to PDA and PLOM. She had a nuclear stress test in March 2015 which showed anterolateral and apical infarct without ischemia. EF 59%. This was unchanged since 2011.  She has a history of pancreatitis, COPD, HTN and hyperlipidemia. She has a history of tobacco abuse. She quit smoking in March 2015.  She is morbidly obese.  On follow up today her primary complaint is of dyspnea on exertion. She states she gets SOB walking 50 ft. Here symptoms vary from day to day. She does have mild swelling in her legs. Her weight is up 12 lbs. She does have OSA and is on CPAP. She does not use inhaler. She still works at [employer] but doesn't think she can do so much longer. No cough. No PND or orthopnea.   Current Outpatient Prescriptions on File Prior to Visit  Medication Sig Dispense Refill  . aspirin EC 81 MG tablet Take 81 mg by mouth daily.    Marland Kitchen buPROPion (WELLBUTRIN SR) 100 MG 12 hr tablet Take 100 mg by mouth 2 (two) times daily.    Marland Kitchen lovastatin (MEVACOR) 40 MG tablet Take 40 mg by mouth at bedtime.    . metoprolol (LOPRESSOR) 50 MG tablet Take 50 mg by mouth 2 (two) times daily.     No current facility-administered medications on file prior to visit.    Allergies  Allergen Reactions  . Niaspan [Niacin] Rash    Past Medical History  Diagnosis Date  . Arm pain, left   . Coronary artery disease   . Myocardial infarction 2009    underwent 5 vessel coronary bypass surgery  . Arrhythmia     she had postoperative atrial fibrillation which resolved  . Hyperlipidemia   . Hypertension   . Tobacco user   . Dyslipidemia   . Obesity   . GERD (gastroesophageal reflux disease)   .  Pancreatitis   . Fatty liver   . COPD (chronic obstructive pulmonary disease)     Past Surgical History  Procedure Laterality Date  . Coronary artery bypass graft  May 21, 2007    included a LIMA graft to LAD, sequential saphenous vein graft to the first and second obtuse marginal vessels--and sequential saphenous vein graft to the PDA and posterior lateral branches of the right coronary    History  Smoking status  . Former Smoker -- 1.00 packs/day  . Types: Cigarettes  Smokeless tobacco  . Never Used    Comment: quit 05/17/2013    History  Alcohol Use No    Family History  Problem Relation Age of Onset  . Heart attack Mother   . Heart disease Mother   . Arrhythmia Father   . Heart disease Sister     stent    Review of Systems: As noted in HPI.  All other systems were reviewed and are negative.  Physical Exam: BP 138/70 mmHg  Pulse 82  Ht 4\' 11"  (1.499 m)  Wt 251 lb 14.4 oz (114.261 kg)  BMI 50.85 kg/m2 Morbidly obese WF in NAD. HEENT: [place]/AT, PERRLA, EOMI. Oropharynx is clear. Neck: unable to assess JVD. No bruits. No adenopathy or thyromegaly. Lungs. Few wheezes CV:  RRR. Normal S1-2. No gallop or murmur.  Abd: obese, soft, NT. BS +. No masses. Extremities: tr-1+ edema. Pulses 2+. Spider veins. Skin: warm and dry. Neuro: alert, oriented x 3. No focal findings.  LABORATORY DATA:  Labs reviewed from 10/20/13. Normal CBC and chemistries. TSH is normal. A1c 5.6%.   Assessment / Plan: 1. CAD s/p MI and subsequent CABG in 2009.  Lexiscan Myoview in March 2015 showed evidence of prior MI without ischemia. EF 59%. Continue risk factor modification. Continue ASA, metoprolol, statin.  2. Dyspnea on exertion. Multiple potential etiologies. These include COPD, OSA, obesity, diastolic dysfunction, or deconditioning. I do not think this is related to ischemia. Will check CBC, BMET, and BNP today. Check Echo and full PFTs. This may guide [place] on further treatment. May need to  consider diuretic therapy if BNP high or if significant diastolic dysfunction on Echo. If PFTs are abnormal would consider inhaler therapy. She has used Spiriva and prn albuterol in the past. Needs to lose weight. Will recommend increased aerobic activity and weight loss.   3. HTN- controlled.  4. Dyslipidemia. High triglycerides.  Needs to focus on weight loss with low carb diet.  5. Morbid obesity  6. Recurrent pancreatitis.  7. COPD.  8. OSA on CPAP.  9. History of tobacco abuse- stopped one year ago.

## 2014-05-26 ENCOUNTER — Telehealth: Payer: Self-pay | Admitting: Cardiology

## 2014-05-26 LAB — BRAIN NATRIURETIC PEPTIDE: Brain Natriuretic Peptide: 69.2 pg/mL (ref 0.0–100.0)

## 2014-05-26 NOTE — Telephone Encounter (Signed)
Received call from patient.Lab results given.

## 2014-05-29 ENCOUNTER — Encounter: Payer: Self-pay | Admitting: Cardiology

## 2014-05-30 ENCOUNTER — Ambulatory Visit (HOSPITAL_COMMUNITY)
Admission: RE | Admit: 2014-05-30 | Discharge: 2014-05-30 | Disposition: A | Discharge status: Home / Self Care | Payer: Medicaid Other | Source: Ambulatory Visit | Attending: Cardiology | Admitting: Cardiology

## 2014-05-30 DIAGNOSIS — I25119 Atherosclerotic heart disease of native coronary artery with unspecified angina pectoris: Secondary | ICD-10-CM

## 2014-05-30 DIAGNOSIS — R0600 Dyspnea, unspecified: Secondary | ICD-10-CM | POA: Insufficient documentation

## 2014-05-30 DIAGNOSIS — E785 Hyperlipidemia, unspecified: Secondary | ICD-10-CM

## 2014-05-30 DIAGNOSIS — Z720 Tobacco use: Secondary | ICD-10-CM

## 2014-05-30 DIAGNOSIS — Z951 Presence of aortocoronary bypass graft: Secondary | ICD-10-CM

## 2014-05-30 DIAGNOSIS — J449 Chronic obstructive pulmonary disease, unspecified: Secondary | ICD-10-CM

## 2014-05-30 DIAGNOSIS — R0609 Other forms of dyspnea: Secondary | ICD-10-CM

## 2014-05-30 DIAGNOSIS — I10 Essential (primary) hypertension: Secondary | ICD-10-CM | POA: Insufficient documentation

## 2014-05-30 NOTE — Progress Notes (Signed)
2D Echo Performed 05/30/2014    Marygrace Drought, RCS

## 2014-05-31 ENCOUNTER — Ambulatory Visit (HOSPITAL_COMMUNITY)
Admission: RE | Admit: 2014-05-31 | Discharge: 2014-05-31 | Disposition: A | Discharge status: Home / Self Care | Payer: Medicaid Other | Source: Ambulatory Visit | Attending: Cardiology | Admitting: Cardiology

## 2014-05-31 DIAGNOSIS — Z87891 Personal history of nicotine dependence: Secondary | ICD-10-CM | POA: Insufficient documentation

## 2014-05-31 DIAGNOSIS — I25119 Atherosclerotic heart disease of native coronary artery with unspecified angina pectoris: Secondary | ICD-10-CM

## 2014-05-31 DIAGNOSIS — Z48812 Encounter for surgical aftercare following surgery on the circulatory system: Secondary | ICD-10-CM | POA: Insufficient documentation

## 2014-05-31 DIAGNOSIS — Z951 Presence of aortocoronary bypass graft: Secondary | ICD-10-CM

## 2014-05-31 DIAGNOSIS — Z720 Tobacco use: Secondary | ICD-10-CM

## 2014-05-31 DIAGNOSIS — I10 Essential (primary) hypertension: Secondary | ICD-10-CM | POA: Diagnosis not present

## 2014-05-31 DIAGNOSIS — J449 Chronic obstructive pulmonary disease, unspecified: Secondary | ICD-10-CM | POA: Diagnosis not present

## 2014-05-31 DIAGNOSIS — R0609 Other forms of dyspnea: Secondary | ICD-10-CM

## 2014-05-31 DIAGNOSIS — E785 Hyperlipidemia, unspecified: Secondary | ICD-10-CM

## 2014-05-31 LAB — PULMONARY FUNCTION TEST
DL/VA % PRED: 115 %
DL/VA: 4.7 ml/min/mmHg/L
DLCO UNC % PRED: 84 %
DLCO unc: 14.84 ml/min/mmHg
FEF 25-75 POST: 2.29 L/s
FEF 25-75 Pre: 2.57 L/sec
FEF2575-%CHANGE-POST: -10 %
FEF2575-%PRED-POST: 102 %
FEF2575-%PRED-PRE: 115 %
FEV1-%CHANGE-POST: 0 %
FEV1-%PRED-PRE: 90 %
FEV1-%Pred-Post: 89 %
FEV1-PRE: 1.99 L
FEV1-Post: 1.97 L
FEV1FVC-%Change-Post: 2 %
FEV1FVC-%PRED-PRE: 106 %
FEV6-%CHANGE-POST: -5 %
FEV6-%PRED-POST: 81 %
FEV6-%Pred-Pre: 87 %
FEV6-POST: 2.23 L
FEV6-Pre: 2.37 L
FEV6FVC-%PRED-PRE: 104 %
FEV6FVC-%Pred-Post: 104 %
FVC-%CHANGE-POST: -3 %
FVC-%PRED-POST: 81 %
FVC-%PRED-PRE: 84 %
FVC-POST: 2.29 L
FVC-Pre: 2.37 L
POST FEV1/FVC RATIO: 86 %
POST FEV6/FVC RATIO: 100 %
PRE FEV1/FVC RATIO: 84 %
Pre FEV6/FVC Ratio: 100 %
RV % pred: 102 %
RV: 1.7 L
TLC % PRED: 96 %
TLC: 4.15 L

## 2014-05-31 MED ORDER — ALBUTEROL SULFATE (2.5 MG/3ML) 0.083% IN NEBU
2.5000 mg | INHALATION_SOLUTION | Freq: Once | RESPIRATORY_TRACT | Status: AC
  Administered 2014-05-31: 2.5 mg via RESPIRATORY_TRACT

## 2014-06-05 ENCOUNTER — Other Ambulatory Visit: Payer: Self-pay

## 2014-06-05 DIAGNOSIS — Z951 Presence of aortocoronary bypass graft: Secondary | ICD-10-CM

## 2014-06-05 DIAGNOSIS — R0609 Other forms of dyspnea: Principal | ICD-10-CM

## 2014-06-05 MED ORDER — POTASSIUM CHLORIDE CRYS ER 20 MEQ PO TBCR: 20.0000 meq | EXTENDED_RELEASE_TABLET | Freq: Every day | ORAL | Status: AC

## 2014-06-05 MED ORDER — FUROSEMIDE 40 MG PO TABS: 40.0000 mg | ORAL_TABLET | Freq: Every day | ORAL | Status: AC

## 2014-06-21 ENCOUNTER — Telehealth: Payer: Self-pay | Admitting: Cardiology

## 2014-06-21 NOTE — Telephone Encounter (Signed)
LMTCB

## 2014-06-21 NOTE — Telephone Encounter (Signed)
April [name] is returning your call . Thanks

## 2014-06-21 NOTE — Telephone Encounter (Signed)
Pt wants to know if she can have her lab work on Wednesday 06-28-14 instead of Monday-06-26-14 please?

## 2014-06-21 NOTE — Telephone Encounter (Signed)
Returned call to patient she stated she will have Bmet done next Wed 06/28/14 instead of Mon 06/26/14.

## 2014-06-28 ENCOUNTER — Encounter: Payer: Self-pay | Admitting: Neurology

## 2014-06-28 ENCOUNTER — Ambulatory Visit (INDEPENDENT_AMBULATORY_CARE_PROVIDER_SITE_OTHER): Payer: Medicaid Other | Admitting: Neurology

## 2014-06-28 VITALS — BP 106/50 | HR 58 | Resp 22 | Ht 59.0 in | Wt 245.0 lb

## 2014-06-28 DIAGNOSIS — M545 Low back pain, unspecified: Secondary | ICD-10-CM

## 2014-06-28 DIAGNOSIS — R0602 Shortness of breath: Secondary | ICD-10-CM

## 2014-06-28 DIAGNOSIS — Z951 Presence of aortocoronary bypass graft: Secondary | ICD-10-CM

## 2014-06-28 DIAGNOSIS — Z9989 Dependence on other enabling machines and devices: Principal | ICD-10-CM

## 2014-06-28 DIAGNOSIS — G4733 Obstructive sleep apnea (adult) (pediatric): Secondary | ICD-10-CM | POA: Diagnosis not present

## 2014-06-28 NOTE — Patient Instructions (Addendum)
Keep up the good work with your CPAP! I will see you back in a year thereafter.

## 2014-06-28 NOTE — Progress Notes (Signed)
Subjective:    Patient ID: April [name] is a 54 y.o. female.  HPI     Interim history:   April [name] is a 54 year old right-handed woman with an underlying medical history of hypertension, hyperlipidemia, ex-smoker (since 05/17/13), COPD, morbid obesity, heart disease, status post MI and CABG (5 vessel in 2011), who presents for followup consultation of her OSA, on treatment with CPAP. The patient is unaccompanied today. I last saw her on 11/03/2013, at which time she reported trouble tolerating the CPAP mask. However, she did endorse that her sleep quality was much better and her nocturia has improved as well as her EDS. She was working on weight loss and had stopped smoking and eliminated sodas. She was compliant with treatment at the time and I suggested she try melatonin to help with sleep, 3-5 mg.  Today, 06/28/2014: I reviewed her CPAP compliance data from 05/27/2014 through 06/25/2014 which is a total of 30 days during which time she used her machine 29 days with percent used days greater than 4 hours at 97%, indicating excellent compliance with an average usage for all days of 6 hours and 19 minutes, residual AHI low at 0.1 per hour and leak acceptable with the 95th percentile at 21.4 L/m on a pressure of 8 cm with EPR of tumor.  Today, 06/28/2014: She reports having had some issues with shortness of breath. She had workup through her cardiologist for this. She had a echocardiogram on 05/30/2014 which I reviewed: EF was 55-60% and findings were benign. She had a PFT on 05/31/2014 which showed benign findings as well. She had some lower extremity swelling for which her cardiologist suggested she take Lasix. She has been on it for about a month and swelling is down. She is scheduled to have a repeat BMP today. About a month ago she had a bout of cough and congestion and was prescribed a cough medicine by her primary care physician but did not have to take an antibiotic. As far as his sleep apnea is  concerned she is compliant with treatment and continues to endorse good results. She had to skip a night when her mother was in the hospital. She would like to try a nasal pillows interface because sometimes the nasal mask dislodges and she also has some discomfort with the headgear. She was reminded by her cardiologist to try to lose weight. He felt that some of her shortness of breath could be secondary to her body habitus. She also suffers from low back pain which limits her walking for longer distance. She quit smoking in March 2015. Her back pain is mostly in the midline without radiation to her legs. She saw an orthopedic doctor and had a lumbar spine MRI which we reviewed. This was on 07/17/2013: Small annular fissure in the foramen at L4-5 on the left. No disc protrusion or spinal stenosis. Mild lower lumbar facet arthropathy. In addition, personally reviewed the images through the PACS system.    Previously:   I first met her on 06/30/2013 at the request of her primary care physician, at which time she reported snoring, excessive daytime somnolence and frequent nighttime awakenings but also arthritic pain in her left shoulder which was bothersome at night. I suggested she return for sleep study. She has split-night sleep study on 08/09/2013 and went over her test results in detail today. Her baseline sleep efficiency was mildly reduced at 86.3% with a latency to sleep of 16 minutes and wake after sleep onset of  5 minutes with multiple moderate sleep fragmentation noted. She had a markedly increased percentage of stage II sleep, absence of slow-wave sleep, and a reduced percentage of REM sleep with a mildly reduced REM latency. She had called snoring. She slept only on the right side. Her total AHI was 31.2 per hour. Baseline oxygen saturation was 90%, nadir was 76% in REM sleep. She was then titrated on CPAP from 5-8 cm. she achieved a markedly increased percentage of REM sleep at 41.8%. Average  oxygen saturation was 92%, nadir was 86%. Snoring was eliminated. She has no significant PLMS before or after CPAP. She did have an AHI of 0 per hour on the final pressure.  I reviewed the patient's CPAP compliance data from 08/31/2013 to 10/03/2013, which is a total of 34 days, during which time the patient used CPAP every day except for 1 day. The average usage for all days was 6 hours and 28 minutes. The percent used days greater than 4 hours was 97 %, indicating excellent compliance. The residual AHI was low at 0.2 per hour, indicating an appropriate treatment pressure of 8 cwp with EPR of 2. Air leak from the mask was acceptable at 21.3 L per minute at the 95th percentile.  I reviewed her compliance data from 10/04/2013 through 11/02/2013 which is a total of 30 days during which time she used her machine every day except for 2 nights. Percent used days greater than 4 hours was 93%, indicating excellent compliance, residual AHI low at 0.3/h and leak acceptable at 20 lpm.   His Past Medical History Is Significant For: Past Medical History  Diagnosis Date  . Arm pain, left   . Coronary artery disease   . Myocardial infarction 2009    underwent 5 vessel coronary bypass surgery  . Arrhythmia     she had postoperative atrial fibrillation which resolved  . Hyperlipidemia   . Hypertension   . Tobacco user   . Dyslipidemia   . Obesity   . GERD (gastroesophageal reflux disease)   . Pancreatitis   . Fatty liver   . COPD (chronic obstructive pulmonary disease)     His Past Surgical History Is Significant For: Past Surgical History  Procedure Laterality Date  . Coronary artery bypass graft  May 21, 2007    included a LIMA graft to LAD, sequential saphenous vein graft to the first and second obtuse marginal vessels--and sequential saphenous vein graft to the PDA and posterior lateral branches of the right coronary    His Family History Is Significant For: Family History  Problem Relation  Age of Onset  . Heart attack Mother   . Heart disease Mother   . Arrhythmia Father   . Heart disease Sister     stent    His Social History Is Significant For: History   Social History  . Marital Status: Married    Spouse Name: Laverna Peace  . Number of Children: 1  . Years of Education: 10   Occupational History  .      applying for disability   Social History Main Topics  . Smoking status: Former Smoker -- 1.00 packs/day    Types: Cigarettes  . Smokeless tobacco: Never Used     Comment: quit 05/17/2013  . Alcohol Use: No  . Drug Use: No  . Sexual Activity: Not on file   Other Topics Concern  . None   Social History Narrative   Patient is left handed and resides with husband  His Allergies Are:  Allergies  Allergen Reactions  . Niaspan [Niacin] Rash  :   His Current Medications Are:  Outpatient Encounter Prescriptions as of 06/28/2014  Medication Sig  . aspirin EC 81 MG tablet Take 81 mg by mouth daily.  Marland Kitchen buPROPion (WELLBUTRIN SR) 100 MG 12 hr tablet Take 100 mg by mouth 2 (two) times daily.  . furosemide (LASIX) 40 MG tablet Take 1 tablet (40 mg total) by mouth daily.  Marland Kitchen lovastatin (MEVACOR) 40 MG tablet Take 40 mg by mouth at bedtime.  . metoprolol (LOPRESSOR) 50 MG tablet Take 50 mg by mouth 2 (two) times daily.  . potassium chloride SA (K-DUR,KLOR-CON) 20 MEQ tablet Take 1 tablet (20 mEq total) by mouth daily.  :  Review of Systems:  Out of a complete 14 point review of systems, all are reviewed and negative with the exception of these symptoms as listed below:   Review of Systems  All other systems reviewed and are negative.  Midline Low back pain without radiation.  Objective:  Neurologic Exam  Physical Exam Physical Examination:   Filed Vitals:   06/28/14 1135  BP: 106/50  Pulse: 58  Resp: 22   General Examination: The patient is a very pleasant 54 y.o. female in no acute distress. She appears well-developed and well-nourished and adequately  groomed. She is obese.   HEENT: Normocephalic, atraumatic, pupils are equal, round and reactive to light and accommodation. Funduscopic exam is normal with sharp disc margins noted. Extraocular tracking is good without limitation to gaze excursion or nystagmus noted. Normal smooth pursuit is noted. Hearing is grossly intact. Face is symmetric with normal facial animation and normal facial sensation. Speech is clear with no dysarthria noted. There is no hypophonia. There is no lip, neck/head, jaw or voice tremor. Neck is supple with full range of passive and active motion. There are no carotid bruits on auscultation. Oropharynx exam reveals: moderate mouth dryness, edentulous state, and moderate airway crowding, due to large tongue, larger uvula and tonsils in place. Mallampati is class III. Tongue protrudes centrally and palate elevates symmetrically. Tonsils are 1+ in size.  Chest: Clear to auscultation without wheezing, rhonchi or crackles noted.  Heart: S1+S2+0, regular and normal without murmurs, rubs or gallops noted.   Abdomen: Soft, non-tender and non-distended with normal bowel sounds appreciated on auscultation.  Extremities: There is trace edema in the distal lower extremities bilaterally. Pedal pulses are intact.  Skin: Warm and dry without trophic changes noted. There are no varicose veins.  Musculoskeletal: exam reveals no obvious joint deformities, tenderness or joint swelling or erythema.   Neurologically:  Mental status: The patient is awake, alert and oriented in all 4 spheres. Her immediate and remote memory, attention, language skills and fund of knowledge are appropriate. There is no evidence of aphasia, agnosia, apraxia or anomia. Speech is clear with normal prosody and enunciation. Thought process is linear. Mood is normal and affect is normal.  Cranial nerves II - XII are as described above under HEENT exam. In addition: shoulder shrug is normal with equal shoulder height  noted. Motor exam: Normal bulk, strength and tone is noted. There is no drift, tremor or rebound. Romberg is negative. Reflexes are 2+ throughout. Babinski: Toes are flexor bilaterally. Fine motor skills and coordination: intact with normal finger taps, normal hand movements, normal rapid alternating patting, normal foot taps and normal foot agility.  Cerebellar testing: No dysmetria or intention tremor on finger to nose testing. There is no truncal  or gait ataxia.  Sensory exam: intact to light touch, pinprick, vibration, temperature sense in the upper and lower extremities.  Gait, station and balance: She stands with mild difficulty, d/t low back pain. No veering to one side is noted. No leaning to one side is noted. Posture is age-appropriate and stance is narrow based. Gait shows normal stride length and normal pace. No problems turning are noted. She turns en bloc. Tandem walk is slightly difficult for her today.               Assessment and Plan:   In summary, Sophiya ELEONORA PEELER is a very pleasant 54 year old female with an underlying medical history of hypertension, hyperlipidemia, ex-smoker (since 05/17/13), COPD, morbid obesity, heart disease, status post MI and CABG (5 vessel in 2011), who presents for followup consultation of her OSA. We again reviewed her sleep test results and her compliance data from the last 30 days. She is congratulated on her adherence to treatment. She has had intermittent low back pain and is advised to try heat pad. She is commended for smoking cessation and trying to lose weight. She needs to exercise more. We also reviewed her recent test results including her echocardiogram and her PFTs. Thankfully these were unremarkable. She would like to use a nasal pillows mask. I placed an order for CPAP supplies and we will fax this to her DME company. Since starting CPAP therapy, she feels improved in her daytime somnolence, the  sleep quality and nocturia.  We again talked about  trying to maintaining a healthy lifestyle in general. I encouraged the patient to eat healthy, exercise daily and keep well hydrated, to keep a scheduled bedtime and wake time routine, to not skip any meals and eat healthy snacks in between meals and to have protein with every meal. I stressed the importance of regular exercise. Since she is doing well from my end of things, I would like to see her back on a yearly basis. I answered all her questions today and she was in agreement. She was encouraged to call with any interim questions, concerns, problems or updates. I spent 25 minutes in total face-to-face time with the patient, more than 50% of which was spent in counseling and coordination of care, reviewing test results, reviewing medication and discussing or reviewing the diagnosis of OSA, LBP, the prognosis and treatment options.

## 2014-06-29 LAB — BASIC METABOLIC PANEL
BUN: 11 mg/dL (ref 6–23)
CHLORIDE: 102 meq/L (ref 96–112)
CO2: 23 meq/L (ref 19–32)
Calcium: 9.3 mg/dL (ref 8.4–10.5)
Creat: 0.9 mg/dL (ref 0.50–1.10)
GLUCOSE: 98 mg/dL (ref 70–99)
POTASSIUM: 4 meq/L (ref 3.5–5.3)
SODIUM: 137 meq/L (ref 135–145)

## 2014-08-21 ENCOUNTER — Telehealth: Payer: Self-pay | Admitting: Cardiology

## 2014-08-21 NOTE — Telephone Encounter (Signed)
Pt called in stating that her daughter has an appt on 6/14 at 9:00am downstairs and she would like to know if she could come in for her appt then to save on gas. Please call  Thanks

## 2014-08-21 NOTE — Telephone Encounter (Signed)
Returned call to patient phone out of order.Patient's son's # listed in chart non working #.

## 2014-08-24 NOTE — Telephone Encounter (Signed)
Returned call to patient's daughter I tried to call back all phone #s non working #s.I called PCP Dr.James Little they gave me new # [phone].Advised Dr.Jordan's schedule is full 08/29/14.Advised keep appointment with him tomorrow 08/25/14 at 11:00 am.

## 2014-08-25 ENCOUNTER — Encounter: Payer: Self-pay | Admitting: Cardiology

## 2014-08-25 ENCOUNTER — Ambulatory Visit (INDEPENDENT_AMBULATORY_CARE_PROVIDER_SITE_OTHER): Payer: Medicaid Other | Admitting: Cardiology

## 2014-08-25 VITALS — BP 144/70 | HR 49 | Ht 59.0 in | Wt 251.1 lb

## 2014-08-25 DIAGNOSIS — I25708 Atherosclerosis of coronary artery bypass graft(s), unspecified, with other forms of angina pectoris: Secondary | ICD-10-CM | POA: Diagnosis not present

## 2014-08-25 DIAGNOSIS — I10 Essential (primary) hypertension: Secondary | ICD-10-CM

## 2014-08-25 DIAGNOSIS — Z951 Presence of aortocoronary bypass graft: Secondary | ICD-10-CM | POA: Diagnosis not present

## 2014-08-25 DIAGNOSIS — R0609 Other forms of dyspnea: Secondary | ICD-10-CM | POA: Diagnosis not present

## 2014-08-25 NOTE — Progress Notes (Signed)
April [name] Date of Birth: Oct 18, 1960 Medical Record #[id number]  History of Present Illness: April [name] is seen for follow up of dyspnea. She has a history of CAD and is s/p MI in 2009 with subsequent CABG by Dr. Roxan Hockey with LIMA to the LAD, SVG sequential to OM1 and OM2, and sequential SVG to PDA and PLOM. She had a nuclear stress test in March 2015 which showed anterolateral and apical infarct without ischemia. EF 59%. This was unchanged since 2011.  She has a history of pancreatitis, COPD, HTN and hyperlipidemia. She has a history of tobacco abuse. She quit smoking in March 2015.  She is morbidly obese.  She was seen in March with primary complaint of increased SOB. Laboratory data was normal including BNP. Echo was normal. PFTs were unremarkable. She states her breathing is the same. She is trying to walk more. Weight is unchanged.  Current Outpatient Prescriptions on File Prior to Visit  Medication Sig Dispense Refill  . aspirin EC 81 MG tablet Take 81 mg by mouth daily.    Marland Kitchen buPROPion (WELLBUTRIN SR) 100 MG 12 hr tablet Take 100 mg by mouth 2 (two) times daily.    . furosemide (LASIX) 40 MG tablet Take 1 tablet (40 mg total) by mouth daily. 30 tablet 6  . lovastatin (MEVACOR) 40 MG tablet Take 40 mg by mouth at bedtime.    . metoprolol (LOPRESSOR) 50 MG tablet Take 50 mg by mouth 2 (two) times daily.    . potassium chloride SA (K-DUR,KLOR-CON) 20 MEQ tablet Take 1 tablet (20 mEq total) by mouth daily. 30 tablet 6   No current facility-administered medications on file prior to visit.    Allergies  Allergen Reactions  . Niaspan [Niacin] Rash    Past Medical History  Diagnosis Date  . Arm pain, left   . Coronary artery disease   . Myocardial infarction 2009    underwent 5 vessel coronary bypass surgery  . Arrhythmia     she had postoperative atrial fibrillation which resolved  . Hyperlipidemia   . Hypertension   . Tobacco user   . Dyslipidemia   . Obesity   . GERD  (gastroesophageal reflux disease)   . Pancreatitis   . Fatty liver   . COPD (chronic obstructive pulmonary disease)     Past Surgical History  Procedure Laterality Date  . Coronary artery bypass graft  May 21, 2007    included a LIMA graft to LAD, sequential saphenous vein graft to the first and second obtuse marginal vessels--and sequential saphenous vein graft to the PDA and posterior lateral branches of the right coronary    History  Smoking status  . Former Smoker -- 1.00 packs/day  . Types: Cigarettes  Smokeless tobacco  . Never Used    Comment: quit 05/17/2013    History  Alcohol Use No    Family History  Problem Relation Age of Onset  . Heart attack Mother   . Heart disease Mother   . Arrhythmia Father   . Heart disease Sister     stent    Review of Systems: As noted in HPI.  All other systems were reviewed and are negative.  Physical Exam: BP 144/70 mmHg  Pulse 49  Ht 4\' 11"  (1.499 m)  Wt 113.898 kg (251 lb 1.6 oz)  BMI 50.69 kg/m2 Morbidly obese WF in NAD. HEENT: [place]/AT, PERRLA, EOMI. Oropharynx is clear. Neck: unable to assess JVD. No bruits. No adenopathy or thyromegaly. Lungs. Few  wheezes CV: RRR. Normal S1-2. No gallop or murmur.  Abd: obese, soft, NT. BS +. No masses. Extremities: tr-1+ edema. Pulses 2+. Spider veins. Skin: warm and dry. Neuro: alert, oriented x 3. No focal findings.  LABORATORY DATA:  Lab Results  Component Value Date   WBC 6.1 05/25/2014   HGB 13.4 05/25/2014   HCT 40.9 05/25/2014   PLT 227 05/25/2014   GLUCOSE 98 06/28/2014   CHOL  10/18/2008    151        ATP III CLASSIFICATION:  <200     mg/dL   Desirable  200-239  mg/dL   Borderline High  >=240    mg/dL   High          TRIG 124 10/18/2008   HDL 23* 10/18/2008   LDLCALC * 10/18/2008    103        Total Cholesterol/HDL:CHD Risk Coronary Heart Disease Risk Table                     Men   Women  1/2 Average Risk   3.4   3.3  Average Risk       5.0   4.4  2 X  Average Risk   9.6   7.1  3 X Average Risk  23.4   11.0        Use the calculated Patient Ratio above and the CHD Risk Table to determine the patient's CHD Risk.        ATP III CLASSIFICATION (LDL):  <100     mg/dL   Optimal  100-129  mg/dL   Near or Above                    Optimal  130-159  mg/dL   Borderline  160-189  mg/dL   High  >190     mg/dL   Very High   ALT 7 11/17/2012   AST 14 11/17/2012   NA 137 06/28/2014   K 4.0 06/28/2014   CL 102 06/28/2014   CREATININE 0.90 06/28/2014   BUN 11 06/28/2014   CO2 23 06/28/2014   TSH 1.832 Test methodology is 3rd generation TSH 10/18/2008   INR 0.89 02/19/2010   HGBA1C  10/18/2008    5.7 (NOTE) The ADA recommends the following therapeutic goal for glycemic control related to Hgb A1c measurement: Goal of therapy: <6.5 Hgb A1c  Reference: American Diabetes Association: Clinical Practice Recommendations 2010, Diabetes Care, 2010, 33: (Suppl  1).  BNP- 69  Echo 05/30/14:Study Conclusions  - Left ventricle: The cavity size was normal. Wall thickness was normal. Systolic function was normal. The estimated ejection fraction was in the range of 55% to 60%. - Mitral valve: Calcified annulus. Mildly thickened leaflets . - Left atrium: The atrium was mildly dilated. - Atrial septum: No defect or patent foramen ovale was identified.  PFTs- Conclusions: The results are within normal limits. Pulmonary Function Diagnosis: Normal Pulmonary Function  Assessment / Plan: 1. CAD s/p MI and subsequent CABG in 2009.  Lexiscan Myoview in March 2015 showed evidence of prior MI without ischemia. EF 59%. Continue risk factor modification. Continue ASA, metoprolol, statin.  2. Dyspnea on exertion. Multiple potential etiologies. Based on recent evaluation I think this is predominantly related to morbid obesity and deconditioning.  Needs to lose weight. Will recommend increased aerobic activity and weight loss.   3. HTN- controlled.  4.  Dyslipidemia. High triglycerides.  Needs to focus on weight loss with  low carb diet.  5. Morbid obesity  6. Recurrent pancreatitis.  7. COPD. PFTs looked surprisingly good.   8. OSA on CPAP.  9. History of tobacco abuse- stopped one year ago.

## 2014-08-25 NOTE — Patient Instructions (Signed)
You need to focus on increasing your aerobic activity and lose weight  Focus on reducing carbohydrate intake with sweets, sodas and white foods- rice, pasta, potatoes, and bread  I will see you in one year.

## 2015-01-23 DIAGNOSIS — Z0289 Encounter for other administrative examinations: Secondary | ICD-10-CM

## 2015-05-22 ENCOUNTER — Encounter: Payer: Self-pay | Admitting: *Deleted

## 2015-06-28 ENCOUNTER — Ambulatory Visit: Payer: Medicaid Other | Admitting: Neurology

## 2015-07-11 ENCOUNTER — Ambulatory Visit: Payer: Medicaid Other | Admitting: Neurology

## 2015-08-02 ENCOUNTER — Ambulatory Visit (INDEPENDENT_AMBULATORY_CARE_PROVIDER_SITE_OTHER): Payer: Medicaid Other | Admitting: Neurology

## 2015-08-02 ENCOUNTER — Encounter: Payer: Self-pay | Admitting: Neurology

## 2015-08-02 VITALS — BP 112/76 | HR 66 | Resp 20 | Ht 59.0 in | Wt 244.0 lb

## 2015-08-02 DIAGNOSIS — Z9989 Dependence on other enabling machines and devices: Principal | ICD-10-CM

## 2015-08-02 DIAGNOSIS — G4733 Obstructive sleep apnea (adult) (pediatric): Secondary | ICD-10-CM

## 2015-08-02 NOTE — Patient Instructions (Addendum)
Please continue using your CPAP regularly. While your insurance requires that you use CPAP at least 4 hours each night on 70% of the nights, I recommend, that you not skip any nights and use it throughout the night if you can. Getting used to CPAP and staying with the treatment long term does take time and patience and discipline. Untreated obstructive sleep apnea when it is moderate to severe can have an adverse impact on cardiovascular health and raise her risk for heart disease, arrhythmias, hypertension, congestive heart failure, stroke and diabetes. Untreated obstructive sleep apnea causes sleep disruption, nonrestorative sleep, and sleep deprivation. This can have an impact on your day to day functioning and cause daytime sleepiness and impairment of cognitive function, memory loss, mood disturbance, and problems focussing. Using CPAP regularly can improve these symptoms.  Keep up the good work! I will see you back in 12 months for sleep apnea check up.   Work on your weight loss. Eliminated sugary drinks.

## 2015-08-02 NOTE — Progress Notes (Signed)
Subjective:    Patient ID: April [name] is a 55 y.o. female.  HPI     Interim history:   April [name] is a 55 year old right-handed woman with an underlying medical history of hypertension, hyperlipidemia, ex-smoker (since 05/17/13), COPD, morbid obesity, heart disease, status post MI and CABG (5 vessel in 2011), who presents for followup consultation of her OSA, on treatment with CPAP. The patient is unaccompanied today. I last saw her on 06/28/2014, at which time she reported having some issues with shortness of breath. She had seen her cardiologist for this. She had an echocardiogram on 05/30/2014 which I reviewed: EF was 55-60% and findings were benign. She had a PFT on 05/31/2014 which showed benign findings as well. She had some lower extremity swelling for which her cardiologist suggested she take Lasix. She has been on it for about a month and swelling is down. She is scheduled to have a repeat BMP today. About a month ago she had a bout of cough and congestion and was prescribed a cough medicine by her primary care physician but did not have to take an antibiotic. As far as his sleep apnea is concerned she is compliant with treatment and continues to endorse good results. She had to skip a night when her mother was in the hospital. She would like to try a nasal pillows interface because sometimes the nasal mask dislodges and she also has some discomfort with the headgear. She was reminded by her cardiologist to try to lose weight. He felt that some of her shortness of breath could be secondary to her body habitus. She also suffers from low back pain which limits her walking for longer distance. She quit smoking in March 2015. Her back pain is mostly in the midline without radiation to her legs. She saw an orthopedic doctor and had a lumbar spine MRI which we reviewed. This was on 07/17/2013: Small annular fissure in the foramen at L4-5 on the left. No disc protrusion or spinal stenosis. Mild lower  lumbar facet arthropathy.   In addition, personally reviewed the images through the PACS system.   Today, 08/02/2015: I reviewed her CPAP compliance data from 07/02/2015 through 07/31/2015 which is a total of 30 days during which time she used her machine 28 days with percent used days greater than 4 hours at 87%, indicating very good compliance with an average usage of 5 hours and 5 minutes, residual AHI low at 0.6 per hour, leak acceptable with the 95th percentile at 21 L/m on a pressure of 8 cm with EPR of 2.  Today, 08/02/2015: She reports doing well overall, had a bout of vertigo some 3 months ago, had to call EMS, blood sugar okay, went to ER in [facility], had, what sounds like a brain MRI. Took meclizine, which helped and symptoms improved after about a day. Had a second attack, milder, about a month later, but meclizine seemed to help.   Previously:   I saw her on 11/03/2013, at which time she reported trouble tolerating the CPAP mask. However, she did endorse that her sleep quality was much better and her nocturia has improved as well as her EDS. She was working on weight loss and had stopped smoking and eliminated sodas. She was compliant with treatment at the time and I suggested she try melatonin to help with sleep, 3-5 mg.  I reviewed her CPAP compliance data from 05/27/2014 through 06/25/2014 which is a total of 30 days during which time she  used her machine 29 days with percent used days greater than 4 hours at 97%, indicating excellent compliance with an average usage for all days of 6 hours and 19 minutes, residual AHI low at 0.1 per hour and leak acceptable with the 95th percentile at 21.4 L/m on a pressure of 8 cm with EPR of tumor.  I first met her on 06/30/2013 at the request of her primary care physician, at which time she reported snoring, excessive daytime somnolence and frequent nighttime awakenings but also arthritic pain in her left shoulder which was bothersome at night. I  suggested she return for sleep study. She has split-night sleep study on 08/09/2013 and went over her test results in detail today. Her baseline sleep efficiency was mildly reduced at 86.3% with a latency to sleep of 16 minutes and wake after sleep onset of 5 minutes with multiple moderate sleep fragmentation noted. She had a markedly increased percentage of stage II sleep, absence of slow-wave sleep, and a reduced percentage of REM sleep with a mildly reduced REM latency. She had called snoring. She slept only on the right side. Her total AHI was 31.2 per hour. Baseline oxygen saturation was 90%, nadir was 76% in REM sleep. She was then titrated on CPAP from 5-8 cm. she achieved a markedly increased percentage of REM sleep at 41.8%. Average oxygen saturation was 92%, nadir was 86%. Snoring was eliminated. She has no significant PLMS before or after CPAP. She did have an AHI of 0 per hour on the final pressure.  I reviewed the patient's CPAP compliance data from 08/31/2013 to 10/03/2013, which is a total of 34 days, during which time the patient used CPAP every day except for 1 day. The average usage for all days was 6 hours and 28 minutes. The percent used days greater than 4 hours was 97 %, indicating excellent compliance. The residual AHI was low at 0.2 per hour, indicating an appropriate treatment pressure of 8 cwp with EPR of 2. Air leak from the mask was acceptable at 21.3 L per minute at the 95th percentile.  I reviewed her compliance data from 10/04/2013 through 11/02/2013 which is a total of 30 days during which time she used her machine every day except for 2 nights. Percent used days greater than 4 hours was 93%, indicating excellent compliance, residual AHI low at 0.3/h and leak acceptable at 20 lpm.  Her Past Medical History Is Significant For: Past Medical History  Diagnosis Date  . Arm pain, left   . Coronary artery disease   . Myocardial infarction [facility]) 2009    underwent 5 vessel  coronary bypass surgery  . Arrhythmia     she had postoperative atrial fibrillation which resolved  . Hyperlipidemia   . Hypertension   . Tobacco user   . Dyslipidemia   . Obesity   . GERD (gastroesophageal reflux disease)   . Pancreatitis   . Fatty liver   . COPD (chronic obstructive pulmonary disease) ([place])     Her Past Surgical History Is Significant For: Past Surgical History  Procedure Laterality Date  . Coronary artery bypass graft  May 21, 2007    included a LIMA graft to LAD, sequential saphenous vein graft to the first and second obtuse marginal vessels--and sequential saphenous vein graft to the PDA and posterior lateral branches of the right coronary    Her Family History Is Significant For: Family History  Problem Relation Age of Onset  . Heart attack Mother   .  Heart disease Mother   . Arrhythmia Father   . Heart disease Sister     stent    Her Social History Is Significant For: Social History   Social History  . Marital Status: Married    Spouse Name: April [name]  . Number of Children: 1  . Years of Education: 10   Occupational History  .      applying for disability   Social History Main Topics  . Smoking status: Former Smoker -- 1.00 packs/day    Types: Cigarettes  . Smokeless tobacco: Never Used     Comment: quit 05/17/2013  . Alcohol Use: No  . Drug Use: No  . Sexual Activity: Not Asked   Other Topics Concern  . None   Social History Narrative   Patient is left handed and resides with husband    Her Allergies Are:  Allergies  Allergen Reactions  . Niaspan [Niacin] Rash  :   Her Current Medications Are:  Outpatient Encounter Prescriptions as of 08/02/2015  Medication Sig  . aspirin EC 81 MG tablet Take 81 mg by mouth daily.  Marland Kitchen buPROPion (WELLBUTRIN SR) 100 MG 12 hr tablet Take 100 mg by mouth 2 (two) times daily.  . furosemide (LASIX) 40 MG tablet Take 1 tablet (40 mg total) by mouth daily.  Marland Kitchen lovastatin (MEVACOR) 40 MG tablet Take 40  mg by mouth at bedtime.  . metoprolol (LOPRESSOR) 50 MG tablet Take 50 mg by mouth 2 (two) times daily.  Marland Kitchen omeprazole (PRILOSEC) 20 MG capsule Take 20 mg by mouth 2 (two) times daily.  . potassium chloride SA (K-DUR,KLOR-CON) 20 MEQ tablet Take 1 tablet (20 mEq total) by mouth daily.   No facility-administered encounter medications on file as of 08/02/2015.  :  Review of Systems:  Out of a complete 14 point review of systems, all are reviewed and negative with the exception of these symptoms as listed below:   Review of Systems  Neurological:       Patient is here for f/u states that she is doing well with CPAP. No new concerns.     Objective:  Neurologic Exam  Physical Exam Physical Examination:   Filed Vitals:   08/02/15 0859  BP: 112/76  Pulse: 66  Resp: 20   General Examination: The patient is a very pleasant 55 y.o. female in no acute distress. She appears well-developed and well-nourished and adequately groomed. She is obese. She is edentulous.  HEENT: Normocephalic, atraumatic, pupils are equal, round and reactive to light and accommodation. Extraocular tracking is good without limitation to gaze excursion or nystagmus noted. Normal smooth pursuit is noted. Hearing is grossly intact. Face is symmetric with normal facial animation and normal facial sensation. Speech is clear with no dysarthria noted. There is no hypophonia. There is no lip, neck/head, jaw or voice tremor. Neck is supple with full range of passive and active motion. There are no carotid bruits on auscultation. Oropharynx exam reveals: moderate mouth dryness, edentulous state, and moderate airway crowding, due to large tongue, larger uvula and tonsils in place. Mallampati is class III. Tongue protrudes centrally and palate elevates symmetrically. Tonsils are 1+ in size.   Chest: Clear to auscultation without wheezing, rhonchi or crackles noted.  Heart: S1+S2+0, regular and normal without murmurs, rubs or gallops  noted.   Abdomen: Soft, non-tender and non-distended with normal bowel sounds appreciated on auscultation.  Extremities: There is no edema in the distal lower extremities bilaterally. Pedal pulses are intact.  Skin:  Warm and dry without trophic changes noted. There are no varicose veins.  Musculoskeletal: exam reveals no obvious joint deformities, tenderness or joint swelling or erythema.   Neurologically:   Mental status: The patient is awake, alert and oriented in all 4 spheres. Her immediate and remote memory, attention, language skills and fund of knowledge are appropriate. There is no evidence of aphasia, agnosia, apraxia or anomia. Speech is clear with normal prosody and enunciation. Thought process is linear. Mood is normal and affect is normal.  Cranial nerves II - XII are as described above under HEENT exam. In addition: shoulder shrug is normal with equal shoulder height noted. Motor exam: Normal bulk, strength and tone is noted. There is no drift, tremor or rebound. Romberg is negative. Reflexes are 2+ throughout. Fine motor skills and coordination: intact with normal finger taps, normal hand movements, normal rapid alternating patting, normal foot taps and normal foot agility.  Cerebellar testing: No dysmetria or intention tremor on finger to nose testing. There is no truncal or gait ataxia.  Sensory exam: intact to light touch in the upper and lower extremities.  Gait, station and balance: She stands with no difficulty. No veering to one side is noted. No leaning to one side is noted. Posture is age-appropriate and stance is narrow based. Gait shows normal stride length and normal pace. No problems turning are noted. She turns en bloc. Tandem walk is slightly difficult for her today, stable              Assessment and Plan:   In summary, April [name] is a very pleasant 55 year old female with an underlying medical history of hypertension, hyperlipidemia, ex-smoker (since 05/17/13),  COPD, morbid obesity, heart disease, status post MI and CABG (5 vessel in 2011), who presents for followup consultation of her OSA. We again reviewed her sleep test results and her most recent compliance data from the last 30 days. She is congratulated on her adherence to treatment. She Is encouraged to try to work hard on weight loss. She continues to feel that CPAP is helpful with respect to daytime somnolence, sleep consolidation, nocturia sleep quality. Physical exam is stable. We again talked about trying to maintaining a healthy lifestyle in general. I encouraged the patient to eat healthy, exercise daily and keep well hydrated, to keep a scheduled bedtime and wake time routine, to not skip any meals and eat healthy snacks in between meals and to have protein with every meal. I stressed the importance of regular exercise. Since she is doing well from my end of things, I would like to see her back in a year. I answered all her questions today and she was in agreement. She was encouraged to call with any interim questions, concerns, problems or updates. I spent 25 minutes in total face-to-face time with the patient, more than 50% of which was spent in counseling and coordination of care, reviewing test results, reviewing medication and discussing or reviewing the diagnosis of OSA, LBP, the prognosis and treatment options.

## 2015-08-09 ENCOUNTER — Ambulatory Visit: Payer: Medicaid Other | Admitting: Neurology

## 2016-07-31 ENCOUNTER — Telehealth: Payer: Self-pay

## 2016-07-31 NOTE — Telephone Encounter (Signed)
I called patient to bring SD card or CPAP machine to appt but no answer and no vm.

## 2016-08-04 ENCOUNTER — Telehealth: Payer: Self-pay

## 2016-08-04 ENCOUNTER — Ambulatory Visit: Payer: Medicaid Other | Admitting: Neurology

## 2016-08-04 NOTE — Telephone Encounter (Signed)
Patient did not show to appt today  

## 2016-08-05 ENCOUNTER — Encounter: Payer: Self-pay | Admitting: Neurology

## 2016-09-30 DIAGNOSIS — R0602 Shortness of breath: Secondary | ICD-10-CM | POA: Diagnosis not present

## 2016-10-08 DIAGNOSIS — C4A62 Merkel cell carcinoma of left upper limb, including shoulder: Secondary | ICD-10-CM | POA: Diagnosis not present

## 2016-10-08 DIAGNOSIS — Z803 Family history of malignant neoplasm of breast: Secondary | ICD-10-CM | POA: Diagnosis not present

## 2016-10-08 DIAGNOSIS — F17210 Nicotine dependence, cigarettes, uncomplicated: Secondary | ICD-10-CM | POA: Diagnosis not present

## 2016-11-12 DIAGNOSIS — C4A62 Merkel cell carcinoma of left upper limb, including shoulder: Secondary | ICD-10-CM | POA: Diagnosis not present

## 2016-11-12 DIAGNOSIS — R590 Localized enlarged lymph nodes: Secondary | ICD-10-CM | POA: Diagnosis not present

## 2016-11-12 DIAGNOSIS — R948 Abnormal results of function studies of other organs and systems: Secondary | ICD-10-CM

## 2017-01-09 DIAGNOSIS — C4A62 Merkel cell carcinoma of left upper limb, including shoulder: Secondary | ICD-10-CM | POA: Diagnosis not present

## 2017-03-23 DIAGNOSIS — C4A62 Merkel cell carcinoma of left upper limb, including shoulder: Secondary | ICD-10-CM | POA: Diagnosis not present

## 2017-03-23 DIAGNOSIS — Z923 Personal history of irradiation: Secondary | ICD-10-CM | POA: Diagnosis not present

## 2017-03-30 DIAGNOSIS — Z923 Personal history of irradiation: Secondary | ICD-10-CM

## 2017-03-30 DIAGNOSIS — C4A62 Merkel cell carcinoma of left upper limb, including shoulder: Secondary | ICD-10-CM | POA: Diagnosis not present

## 2017-04-17 DIAGNOSIS — Z923 Personal history of irradiation: Secondary | ICD-10-CM

## 2017-04-17 DIAGNOSIS — I890 Lymphedema, not elsewhere classified: Secondary | ICD-10-CM

## 2017-04-17 DIAGNOSIS — C4A62 Merkel cell carcinoma of left upper limb, including shoulder: Secondary | ICD-10-CM | POA: Diagnosis not present

## 2017-05-07 DIAGNOSIS — C4A62 Merkel cell carcinoma of left upper limb, including shoulder: Secondary | ICD-10-CM

## 2017-05-07 DIAGNOSIS — C7B1 Secondary Merkel cell carcinoma: Secondary | ICD-10-CM | POA: Diagnosis not present

## 2017-05-28 DIAGNOSIS — Z923 Personal history of irradiation: Secondary | ICD-10-CM | POA: Diagnosis not present

## 2017-05-28 DIAGNOSIS — C7B1 Secondary Merkel cell carcinoma: Secondary | ICD-10-CM | POA: Diagnosis not present

## 2017-05-28 DIAGNOSIS — D61810 Antineoplastic chemotherapy induced pancytopenia: Secondary | ICD-10-CM

## 2017-05-28 DIAGNOSIS — C4A62 Merkel cell carcinoma of left upper limb, including shoulder: Secondary | ICD-10-CM | POA: Diagnosis not present

## 2017-05-28 DIAGNOSIS — R1310 Dysphagia, unspecified: Secondary | ICD-10-CM

## 2017-06-29 DIAGNOSIS — I890 Lymphedema, not elsewhere classified: Secondary | ICD-10-CM | POA: Diagnosis not present

## 2017-06-29 DIAGNOSIS — C4A62 Merkel cell carcinoma of left upper limb, including shoulder: Secondary | ICD-10-CM | POA: Diagnosis not present

## 2017-06-29 DIAGNOSIS — Z923 Personal history of irradiation: Secondary | ICD-10-CM

## 2017-06-29 DIAGNOSIS — E876 Hypokalemia: Secondary | ICD-10-CM

## 2017-06-29 DIAGNOSIS — C7B1 Secondary Merkel cell carcinoma: Secondary | ICD-10-CM | POA: Diagnosis not present

## 2017-06-29 DIAGNOSIS — D649 Anemia, unspecified: Secondary | ICD-10-CM

## 2017-07-20 DIAGNOSIS — C7B1 Secondary Merkel cell carcinoma: Secondary | ICD-10-CM | POA: Diagnosis not present

## 2017-07-20 DIAGNOSIS — C4A62 Merkel cell carcinoma of left upper limb, including shoulder: Secondary | ICD-10-CM | POA: Diagnosis not present

## 2017-09-04 DIAGNOSIS — C7B1 Secondary Merkel cell carcinoma: Secondary | ICD-10-CM | POA: Diagnosis not present

## 2017-09-04 DIAGNOSIS — C4A62 Merkel cell carcinoma of left upper limb, including shoulder: Secondary | ICD-10-CM | POA: Diagnosis not present

## 2017-09-04 DIAGNOSIS — R222 Localized swelling, mass and lump, trunk: Secondary | ICD-10-CM

## 2017-09-04 DIAGNOSIS — D649 Anemia, unspecified: Secondary | ICD-10-CM

## 2017-09-04 DIAGNOSIS — I890 Lymphedema, not elsewhere classified: Secondary | ICD-10-CM | POA: Diagnosis not present

## 2017-11-17 DIAGNOSIS — B9562 Methicillin resistant Staphylococcus aureus infection as the cause of diseases classified elsewhere: Secondary | ICD-10-CM

## 2017-11-17 DIAGNOSIS — C4A62 Merkel cell carcinoma of left upper limb, including shoulder: Secondary | ICD-10-CM

## 2017-11-17 DIAGNOSIS — D696 Thrombocytopenia, unspecified: Secondary | ICD-10-CM | POA: Diagnosis not present

## 2017-11-17 DIAGNOSIS — D649 Anemia, unspecified: Secondary | ICD-10-CM | POA: Diagnosis not present

## 2017-11-17 DIAGNOSIS — C7B1 Secondary Merkel cell carcinoma: Secondary | ICD-10-CM | POA: Diagnosis not present

## 2017-11-17 DIAGNOSIS — L089 Local infection of the skin and subcutaneous tissue, unspecified: Secondary | ICD-10-CM

## 2017-12-31 DIAGNOSIS — C4A62 Merkel cell carcinoma of left upper limb, including shoulder: Secondary | ICD-10-CM

## 2017-12-31 DIAGNOSIS — C7B1 Secondary Merkel cell carcinoma: Secondary | ICD-10-CM | POA: Diagnosis not present

## 2017-12-31 DIAGNOSIS — R229 Localized swelling, mass and lump, unspecified: Secondary | ICD-10-CM | POA: Diagnosis not present

## 2018-01-08 DIAGNOSIS — T451X1A Poisoning by antineoplastic and immunosuppressive drugs, accidental (unintentional), initial encounter: Secondary | ICD-10-CM

## 2018-01-12 DIAGNOSIS — Z8614 Personal history of Methicillin resistant Staphylococcus aureus infection: Secondary | ICD-10-CM

## 2018-01-12 DIAGNOSIS — C4A62 Merkel cell carcinoma of left upper limb, including shoulder: Secondary | ICD-10-CM

## 2018-01-12 DIAGNOSIS — C7B1 Secondary Merkel cell carcinoma: Secondary | ICD-10-CM | POA: Diagnosis not present

## 2018-01-12 DIAGNOSIS — D649 Anemia, unspecified: Secondary | ICD-10-CM | POA: Diagnosis not present

## 2018-02-02 DIAGNOSIS — E876 Hypokalemia: Secondary | ICD-10-CM

## 2018-02-02 DIAGNOSIS — D649 Anemia, unspecified: Secondary | ICD-10-CM

## 2018-02-02 DIAGNOSIS — C4A62 Merkel cell carcinoma of left upper limb, including shoulder: Secondary | ICD-10-CM

## 2018-02-02 DIAGNOSIS — Z8614 Personal history of Methicillin resistant Staphylococcus aureus infection: Secondary | ICD-10-CM

## 2018-02-02 DIAGNOSIS — C7B1 Secondary Merkel cell carcinoma: Secondary | ICD-10-CM

## 2018-02-02 DIAGNOSIS — Z9221 Personal history of antineoplastic chemotherapy: Secondary | ICD-10-CM

## 2018-02-02 DIAGNOSIS — D696 Thrombocytopenia, unspecified: Secondary | ICD-10-CM

## 2018-02-02 DIAGNOSIS — I889 Nonspecific lymphadenitis, unspecified: Secondary | ICD-10-CM

## 2018-02-09 DIAGNOSIS — R197 Diarrhea, unspecified: Secondary | ICD-10-CM

## 2018-02-09 DIAGNOSIS — J209 Acute bronchitis, unspecified: Secondary | ICD-10-CM

## 2018-02-09 DIAGNOSIS — C7B1 Secondary Merkel cell carcinoma: Secondary | ICD-10-CM | POA: Diagnosis not present

## 2018-02-09 DIAGNOSIS — E860 Dehydration: Secondary | ICD-10-CM

## 2018-02-09 DIAGNOSIS — D701 Agranulocytosis secondary to cancer chemotherapy: Secondary | ICD-10-CM

## 2018-02-09 DIAGNOSIS — I889 Nonspecific lymphadenitis, unspecified: Secondary | ICD-10-CM

## 2018-02-09 DIAGNOSIS — C4A62 Merkel cell carcinoma of left upper limb, including shoulder: Secondary | ICD-10-CM | POA: Diagnosis not present

## 2018-02-09 DIAGNOSIS — Z9221 Personal history of antineoplastic chemotherapy: Secondary | ICD-10-CM

## 2018-02-09 DIAGNOSIS — D649 Anemia, unspecified: Secondary | ICD-10-CM | POA: Diagnosis not present

## 2018-02-09 DIAGNOSIS — D696 Thrombocytopenia, unspecified: Secondary | ICD-10-CM

## 2018-03-29 DIAGNOSIS — I889 Nonspecific lymphadenitis, unspecified: Secondary | ICD-10-CM

## 2018-03-29 DIAGNOSIS — D61810 Antineoplastic chemotherapy induced pancytopenia: Secondary | ICD-10-CM | POA: Diagnosis not present

## 2018-03-29 DIAGNOSIS — R351 Nocturia: Secondary | ICD-10-CM

## 2018-03-29 DIAGNOSIS — J209 Acute bronchitis, unspecified: Secondary | ICD-10-CM

## 2018-03-29 DIAGNOSIS — C4A62 Merkel cell carcinoma of left upper limb, including shoulder: Secondary | ICD-10-CM

## 2018-03-29 DIAGNOSIS — C7B1 Secondary Merkel cell carcinoma: Secondary | ICD-10-CM

## 2018-04-05 DIAGNOSIS — C4A62 Merkel cell carcinoma of left upper limb, including shoulder: Secondary | ICD-10-CM

## 2018-04-06 DIAGNOSIS — C4A62 Merkel cell carcinoma of left upper limb, including shoulder: Secondary | ICD-10-CM | POA: Diagnosis not present

## 2018-04-06 DIAGNOSIS — C7B1 Secondary Merkel cell carcinoma: Secondary | ICD-10-CM | POA: Diagnosis not present

## 2018-04-29 DIAGNOSIS — C7B1 Secondary Merkel cell carcinoma: Secondary | ICD-10-CM

## 2018-04-29 DIAGNOSIS — C4A62 Merkel cell carcinoma of left upper limb, including shoulder: Secondary | ICD-10-CM | POA: Diagnosis not present

## 2018-07-16 DEATH — deceased
